# Patient Record
Sex: Female | Born: 1960 | Race: White | Hispanic: No | Marital: Married | Smoking: Never smoker
Health system: Southern US, Community
[De-identification: ages and names within clinical notes are randomized; demographics above are authoritative.]

## PROBLEM LIST (undated history)

## (undated) DIAGNOSIS — E039 Hypothyroidism, unspecified: Secondary | ICD-10-CM

## (undated) DIAGNOSIS — R32 Unspecified urinary incontinence: Secondary | ICD-10-CM

## (undated) DIAGNOSIS — R7309 Other abnormal glucose: Secondary | ICD-10-CM

## (undated) DIAGNOSIS — R7989 Other specified abnormal findings of blood chemistry: Secondary | ICD-10-CM

## (undated) DIAGNOSIS — T7840XA Allergy, unspecified, initial encounter: Secondary | ICD-10-CM

## (undated) DIAGNOSIS — R748 Abnormal levels of other serum enzymes: Secondary | ICD-10-CM

## (undated) DIAGNOSIS — K219 Gastro-esophageal reflux disease without esophagitis: Secondary | ICD-10-CM

## (undated) DIAGNOSIS — L719 Rosacea, unspecified: Secondary | ICD-10-CM

## (undated) DIAGNOSIS — E7800 Pure hypercholesterolemia, unspecified: Secondary | ICD-10-CM

## (undated) DIAGNOSIS — R945 Abnormal results of liver function studies: Secondary | ICD-10-CM

## (undated) DIAGNOSIS — E079 Disorder of thyroid, unspecified: Secondary | ICD-10-CM

## (undated) DIAGNOSIS — D649 Anemia, unspecified: Secondary | ICD-10-CM

## (undated) DIAGNOSIS — M810 Age-related osteoporosis without current pathological fracture: Secondary | ICD-10-CM

## (undated) HISTORY — PX: URETHRAL SLING: SHX2621

## (undated) HISTORY — DX: Abnormal results of liver function studies: R94.5

## (undated) HISTORY — PX: OTHER SURGICAL HISTORY: SHX169

## (undated) HISTORY — DX: Abnormal levels of other serum enzymes: R74.8

## (undated) HISTORY — DX: Disorder of thyroid, unspecified: E07.9

## (undated) HISTORY — DX: Rosacea, unspecified: L71.9

## (undated) HISTORY — PX: CHOLECYSTECTOMY: SHX55

## (undated) HISTORY — PX: CERVICAL BIOPSY  W/ LOOP ELECTRODE EXCISION: SUR135

## (undated) HISTORY — DX: Unspecified urinary incontinence: R32

## (undated) HISTORY — DX: Allergy, unspecified, initial encounter: T78.40XA

## (undated) HISTORY — PX: GYNECOLOGIC CRYOSURGERY: SHX857

## (undated) HISTORY — DX: Anemia, unspecified: D64.9

## (undated) HISTORY — PX: TUBAL LIGATION: SHX77

## (undated) HISTORY — PX: COLPOSCOPY: SHX161

## (undated) HISTORY — DX: Pure hypercholesterolemia, unspecified: E78.00

## (undated) HISTORY — DX: Age-related osteoporosis without current pathological fracture: M81.0

## (undated) HISTORY — DX: Other specified abnormal findings of blood chemistry: R79.89

## (undated) HISTORY — DX: Other abnormal glucose: R73.09

---

## 1991-11-30 HISTORY — PX: TUBAL LIGATION: SHX77

## 1998-12-03 ENCOUNTER — Other Ambulatory Visit: Admission: RE | Admit: 1998-12-03 | Discharge: 1998-12-03 | Payer: Self-pay | Admitting: Obstetrics and Gynecology

## 2000-01-14 ENCOUNTER — Other Ambulatory Visit: Admission: RE | Admit: 2000-01-14 | Discharge: 2000-01-14 | Payer: Self-pay | Admitting: Obstetrics and Gynecology

## 2001-02-08 ENCOUNTER — Other Ambulatory Visit: Admission: RE | Admit: 2001-02-08 | Discharge: 2001-02-08 | Payer: Self-pay | Admitting: Obstetrics and Gynecology

## 2002-02-20 ENCOUNTER — Other Ambulatory Visit: Admission: RE | Admit: 2002-02-20 | Discharge: 2002-02-20 | Payer: Self-pay | Admitting: Obstetrics and Gynecology

## 2003-03-04 ENCOUNTER — Other Ambulatory Visit: Admission: RE | Admit: 2003-03-04 | Discharge: 2003-03-04 | Payer: Self-pay | Admitting: Obstetrics and Gynecology

## 2004-03-11 ENCOUNTER — Other Ambulatory Visit: Admission: RE | Admit: 2004-03-11 | Discharge: 2004-03-11 | Payer: Self-pay | Admitting: Obstetrics and Gynecology

## 2005-03-15 ENCOUNTER — Other Ambulatory Visit: Admission: RE | Admit: 2005-03-15 | Discharge: 2005-03-15 | Payer: Self-pay | Admitting: Obstetrics and Gynecology

## 2005-06-14 ENCOUNTER — Encounter: Admission: RE | Admit: 2005-06-14 | Discharge: 2005-06-14 | Payer: Self-pay | Admitting: Obstetrics and Gynecology

## 2006-09-01 ENCOUNTER — Ambulatory Visit (HOSPITAL_COMMUNITY): Admission: RE | Admit: 2006-09-01 | Discharge: 2006-09-01 | Payer: Self-pay | Admitting: Obstetrics and Gynecology

## 2013-01-23 ENCOUNTER — Other Ambulatory Visit: Payer: Self-pay | Admitting: Obstetrics and Gynecology

## 2013-01-23 DIAGNOSIS — R928 Other abnormal and inconclusive findings on diagnostic imaging of breast: Secondary | ICD-10-CM

## 2013-02-02 ENCOUNTER — Other Ambulatory Visit: Payer: Self-pay

## 2013-02-06 ENCOUNTER — Ambulatory Visit
Admission: RE | Admit: 2013-02-06 | Discharge: 2013-02-06 | Disposition: A | Discharge status: Home / Self Care | Payer: BC Managed Care – PPO | Source: Ambulatory Visit | Attending: Obstetrics and Gynecology | Admitting: Obstetrics and Gynecology

## 2013-02-06 DIAGNOSIS — R928 Other abnormal and inconclusive findings on diagnostic imaging of breast: Secondary | ICD-10-CM

## 2017-10-26 ENCOUNTER — Other Ambulatory Visit: Payer: Self-pay | Admitting: Nephrology

## 2017-10-26 ENCOUNTER — Ambulatory Visit
Admission: RE | Admit: 2017-10-26 | Discharge: 2017-10-26 | Disposition: A | Discharge status: Home / Self Care | Payer: Self-pay | Source: Ambulatory Visit | Attending: Nephrology | Admitting: Nephrology

## 2017-10-26 DIAGNOSIS — R319 Hematuria, unspecified: Secondary | ICD-10-CM

## 2017-10-26 DIAGNOSIS — N289 Disorder of kidney and ureter, unspecified: Secondary | ICD-10-CM

## 2017-11-29 HISTORY — PX: COLONOSCOPY W/ BIOPSIES: SHX1374

## 2018-03-09 ENCOUNTER — Ambulatory Visit (INDEPENDENT_AMBULATORY_CARE_PROVIDER_SITE_OTHER): Payer: No Typology Code available for payment source | Admitting: Obstetrics and Gynecology

## 2018-03-09 ENCOUNTER — Other Ambulatory Visit: Payer: Self-pay

## 2018-03-09 ENCOUNTER — Other Ambulatory Visit (HOSPITAL_COMMUNITY)
Admission: RE | Admit: 2018-03-09 | Discharge: 2018-03-09 | Disposition: A | Discharge status: Home / Self Care | Payer: No Typology Code available for payment source | Source: Ambulatory Visit | Attending: Obstetrics and Gynecology | Admitting: Obstetrics and Gynecology

## 2018-03-09 ENCOUNTER — Encounter: Payer: Self-pay | Admitting: Obstetrics and Gynecology

## 2018-03-09 VITALS — BP 120/78 | HR 94 | Resp 16 | Ht 62.5 in | Wt 138.0 lb

## 2018-03-09 DIAGNOSIS — Z01419 Encounter for gynecological examination (general) (routine) without abnormal findings: Secondary | ICD-10-CM | POA: Diagnosis not present

## 2018-03-09 DIAGNOSIS — Z23 Encounter for immunization: Secondary | ICD-10-CM | POA: Diagnosis not present

## 2018-03-09 DIAGNOSIS — N951 Menopausal and female climacteric states: Secondary | ICD-10-CM | POA: Diagnosis not present

## 2018-03-09 NOTE — Progress Notes (Signed)
57 y.o. G64P0002 Divorced Caucasian female here as a new patient for an annual exam. Referred from family members.  No menses for over one year. Still with some night sweats.  Took Paxil and it worked for hot flashes but had side effects of feeling like she was not interacting with people and had sexual dysfunction.   Vaginal dryness.   Midurethral sling in 2008 with Dr. Arelia Sneddon. Has slower voiding.  No stress incontinence.  Has some urgency but no incontinence with this.   PCP: [facility]  Endocrinologist:  [facility], Dr. Dalbert Mayotte.   Patient's last menstrual period was 12/13/2016.           Sexually active: Yes.    The current method of family planning is tubal ligation.    Exercising: No.  The patient does not participate in regular exercise at present. Smoker:  no  Health Maintenance: Pap:  March 2018 per patient normal History of abnormal Pap:  Yes, patient has had colposcopy and LEEP in alte 1980s or early 1990s.  MMG:  March 2018 done at [facility] - normal per patient Colonoscopy:  12/01/17 polyps removed -- f/u 5 years.  [facility] in [place].  BMD:   March 2018  Result  Done at [facility] -- Osteopenia per patient TDaP:  2009 Gardasil:   n/a HIV: donated blood 2 months ago Hep C: donated blood 2 months ago Screening Labs:  Discuss today   reports that she has never smoked. She has never used smokeless tobacco. She reports that she drank alcohol. She reports that she does not use drugs.  Past Medical History:  Diagnosis Date  . Anemia   . Thyroid disease    hypothyroidism  . Urinary incontinence     Past Surgical History:  Procedure Laterality Date  . CERVICAL BIOPSY  W/ LOOP ELECTRODE EXCISION    . CHOLECYSTECTOMY    . COLPOSCOPY    . Gum Grafts    . GYNECOLOGIC CRYOSURGERY    . TUBAL LIGATION    . URETHRAL SLING      Current Outpatient Medications  Medication Sig Dispense Refill  .  Cholecalciferol (VITAMIN D3) 2000 units TABS Take 1 tablet by mouth daily.    Marland Kitchen levothyroxine (SYNTHROID, LEVOTHROID) 50 MCG tablet Take 50 mcg by mouth daily before breakfast.     No current facility-administered medications for this visit.     Family History  Problem Relation Age of Onset  . Hyperlipidemia Mother   . Gout Father   . Arthritis Father   . Prostate cancer Paternal Grandfather     ROS:  Pertinent items are noted in HPI.  Otherwise, a comprehensive ROS was negative.  Exam:   BP 120/78 (BP Location: Right Arm, Patient Position: Sitting, Cuff Size: Normal)   Pulse 94   Resp 16   Ht 5' 2.5" (1.588 m)   Wt 138 lb (62.6 kg)   LMP 12/13/2016   BMI 24.84 kg/m     General appearance: alert, cooperative and appears stated age Head: Normocephalic, without obvious abnormality, atraumatic Neck: no adenopathy, supple, symmetrical, trachea midline and thyroid normal to inspection and palpation Lungs: clear to auscultation bilaterally Breasts: normal appearance, no masses or tenderness, No nipple retraction or dimpling, No nipple discharge or bleeding, No axillary or supraclavicular adenopathy Heart: regular rate and rhythm Abdomen: soft, non-tender; no masses, no organomegaly Extremities: extremities normal, atraumatic, no cyanosis or edema Skin: Skin color, texture, turgor normal.  No rashes or lesions Lymph nodes: Cervical, supraclavicular, and axillary nodes normal. No abnormal inguinal nodes palpated Neurologic: Grossly normal  Pelvic: External genitalia:  no lesions              Urethra:  normal appearing urethra with no masses, tenderness or lesions              Bartholins and Skenes: normal                 Vagina: normal appearing vagina with normal color and discharge, no lesions              Cervix: no lesions              Pap taken: Yes.   Bimanual Exam:  Uterus:  normal size, contour, position, consistency, mobility, non-tender              Adnexa: no mass,  fullness, tenderness              Rectal exam: Yes.  .  Confirms.              Anus:  normal sphincter tone, no lesions  Chaperone was present for exam.  Assessment:   Well woman visit with normal exam. Hx midurethral sling 2008. Osteopenia.  Menopausal symptoms.  Vaginal dryness. Hx low vit D. Hypothyroidism.  Plan: Mammogram screening discussed. Recommended self breast awareness. Pap and HR HPV as above. Guidelines for Calcium, Vitamin D, regular exercise program including cardiovascular and weight bearing exercise. Routine labs and FSH. Herbal tx for hot flashes.  I discussed water based lubricants and cooking oils for hydration.  Mentioned vaginal estrogen as well. BMD next year. TDap today. Follow up annually and prn.   After visit summary provided.

## 2018-03-09 NOTE — Patient Instructions (Signed)
EXERCISE AND DIET:  We recommended that you start or continue a regular exercise program for good health. Regular exercise means any activity that makes your heart beat faster and makes you sweat.  We recommend exercising at least 30 minutes per day at least 3 days a week, preferably 4 or 5.  We also recommend a diet low in fat and sugar.  Inactivity, poor dietary choices and obesity can cause diabetes, heart attack, stroke, and kidney damage, among others.    ALCOHOL AND SMOKING:  Women should limit their alcohol intake to no more than 7 drinks/beers/glasses of wine (combined, not each!) per week. Moderation of alcohol intake to this level decreases your risk of breast cancer and liver damage. And of course, no recreational drugs are part of a healthy lifestyle.  And absolutely no smoking or even second hand smoke. Most people know smoking can cause heart and lung diseases, but did you know it also contributes to weakening of your bones? Aging of your skin?  Yellowing of your teeth and nails?  CALCIUM AND VITAMIN D:  Adequate intake of calcium and Vitamin D are recommended.  The recommendations for exact amounts of these supplements seem to change often, but generally speaking 600 mg of calcium (either carbonate or citrate) and 800 units of Vitamin D per day seems prudent. Certain women may benefit from higher intake of Vitamin D.  If you are among these women, your doctor will have told you during your visit.    PAP SMEARS:  Pap smears, to check for cervical cancer or precancers,  have traditionally been done yearly, although recent scientific advances have shown that most women can have pap smears less often.  However, every woman still should have a physical exam from her gynecologist every year. It will include a breast check, inspection of the vulva and vagina to check for abnormal growths or skin changes, a visual exam of the cervix, and then an exam to evaluate the size and shape of the uterus and  ovaries.  And after 56 years of age, a rectal exam is indicated to check for rectal cancers. We will also provide age appropriate advice regarding health maintenance, like when you should have certain vaccines, screening for sexually transmitted diseases, bone density testing, colonoscopy, mammograms, etc.   MAMMOGRAMS:  All women over 40 years old should have a yearly mammogram. Many facilities now offer a "3D" mammogram, which may cost around $50 extra out of pocket. If possible,  we recommend you accept the option to have the 3D mammogram performed.  It both reduces the number of women who will be called back for extra views which then turn out to be normal, and it is better than the routine mammogram at detecting truly abnormal areas.    COLONOSCOPY:  Colonoscopy to screen for colon cancer is recommended for all women at age 50.  We know, you hate the idea of the prep.  We agree, BUT, having colon cancer and not knowing it is worse!!  Colon cancer so often starts as a polyp that can be seen and removed at colonscopy, which can quite literally save your life!  And if your first colonoscopy is normal and you have no family history of colon cancer, most women don't have to have it again for 10 years.  Once every ten years, you can do something that may end up saving your life, right?  We will be happy to help you get it scheduled when you are ready.    Be sure to check your insurance coverage so you understand how much it will cost.  It may be covered as a preventative service at no cost, but you should check your particular policy.     Menopause and Herbal Products What is menopause? Menopause is the normal time of life when menstrual periods decrease in frequency and eventually stop completely. This process can take several years for some women. Menopause is complete when you have had an absence of menstruation for a full year since your last menstrual period. It usually occurs between the ages of 48 and  55. It is not common for menopause to begin before the age of 40. During menopause, your body stops producing the female hormones estrogen and progesterone. Common symptoms associated with this loss of hormones (vasomotor symptoms) are:  Hot flashes.  Hot flushes.  Night sweats.  Other common symptoms and complications of menopause include:  Decrease in sex drive.  Vaginal dryness and thinning of the walls of the vagina. This can make sex painful.  Dryness of the skin and development of wrinkles.  Headaches.  Tiredness.  Irritability.  Memory problems.  Weight gain.  Bladder infections.  Hair growth on the face and chest.  Inability to reproduce offspring (infertility).  Loss of density in the bones (osteoporosis) increasing your risk for breaks (fractures).  Depression.  Hardening and narrowing of the arteries (atherosclerosis). This increases your risk of heart attack and stroke.  What treatment options are available? There are many treatment choices for menopause symptoms. The most common treatment is hormone replacement therapy. Many alternative therapies for menopause are emerging, including the use of herbal products. These supplements can be found in the form of herbs, teas, oils, tinctures, and pills. Common herbal supplements for menopause are made from plants that contain phytoestrogens. Phytoestrogens are compounds that occur naturally in plants and plant products. They act like estrogen in the body. Foods and herbs that contain phytoestrogens include:  Soy.  Flax seeds.  Red clover.  Ginseng.  What menopause symptoms may be helped if I use herbal products?  Vasomotor symptoms. These may be helped by: ? Soy. Some studies show that soy may have a moderate benefit for hot flashes. ? Black cohosh. There is limited evidence indicating this may be beneficial for hot flashes.  Symptoms that are related to heart and blood vessel disease. These may be  helped by soy. Studies have shown that soy can help to lower cholesterol.  Depression. This may be helped by: ? St. John's wort. There is limited evidence that shows this may help mild to moderate depression. ? Black cohosh. There is evidence that this may help depression and mood swings.  Osteoporosis. Soy may help to decrease bone loss that is associated with menopause and may prevent osteoporosis. Limited evidence indicates that red clover may offer some bone loss protection as well. Other herbal products that are commonly used during menopause lack enough evidence to support their use as a replacement for conventional menopause therapies. These products include evening primrose, ginseng, and red clover. What are the cases when herbal products should not be used during menopause? Do not use herbal products during menopause without your health care provider's approval if:  You are taking medicine.  You have a preexisting liver condition.  Are there any risks in my taking herbal products during menopause? If you choose to use herbal products to help with symptoms of menopause, keep in mind that:  Different supplements have different and unmeasured   amounts of herbal ingredients.  Herbal products are not regulated the same way that medicines are.  Concentrations of herbs may vary depending on the way they are prepared. For example, the concentration may be different in a pill, tea, oil, and tincture.  Little is known about the risks of using herbal products, particularly the risks of long-term use.  Some herbal supplements can be harmful when combined with certain medicines.  Most commonly reported side effects of herbal products are mild. However, if used improperly, many herbal supplements can cause serious problems. Talk to your health care provider before starting any herbal product. If problems develop, stop taking the supplement and let your health care provider know. This  information is not intended to replace advice given to you by your health care provider. Make sure you discuss any questions you have with your health care provider. Document Released: 05/03/2008 Document Revised: 10/12/2016 Document Reviewed: 04/30/2014 Elsevier Interactive Patient Education  2017 Elsevier Inc.  

## 2018-03-10 ENCOUNTER — Other Ambulatory Visit: Payer: Self-pay | Admitting: Obstetrics and Gynecology

## 2018-03-10 ENCOUNTER — Encounter: Payer: Self-pay | Admitting: Obstetrics and Gynecology

## 2018-03-10 DIAGNOSIS — Z1231 Encounter for screening mammogram for malignant neoplasm of breast: Secondary | ICD-10-CM

## 2018-03-10 LAB — COMPREHENSIVE METABOLIC PANEL
ALBUMIN: 4.2 g/dL (ref 3.5–5.5)
ALK PHOS: 145 IU/L — AB (ref 39–117)
ALT: 50 IU/L — AB (ref 0–32)
AST: 44 IU/L — ABNORMAL HIGH (ref 0–40)
Albumin/Globulin Ratio: 1.4 (ref 1.2–2.2)
BUN / CREAT RATIO: 26 — AB (ref 9–23)
BUN: 24 mg/dL (ref 6–24)
CHLORIDE: 102 mmol/L (ref 96–106)
CO2: 25 mmol/L (ref 20–29)
Calcium: 9.4 mg/dL (ref 8.7–10.2)
Creatinine, Ser: 0.91 mg/dL (ref 0.57–1.00)
GFR calc Af Amer: 82 mL/min/{1.73_m2} (ref >59)
GFR calc non Af Amer: 71 mL/min/{1.73_m2} (ref >59)
GLUCOSE: 115 mg/dL — AB (ref 65–99)
Globulin, Total: 3 g/dL (ref 1.5–4.5)
Potassium: 4.2 mmol/L (ref 3.5–5.2)
Sodium: 142 mmol/L (ref 134–144)
Total Protein: 7.2 g/dL (ref 6.0–8.5)

## 2018-03-10 LAB — LIPID PANEL
CHOLESTEROL TOTAL: 251 mg/dL — AB (ref 100–199)
Chol/HDL Ratio: 2.9 ratio (ref 0.0–4.4)
HDL: 87 mg/dL (ref >39)
LDL CALC: 145 mg/dL — AB (ref 0–99)
TRIGLYCERIDES: 96 mg/dL (ref 0–149)
VLDL Cholesterol Cal: 19 mg/dL (ref 5–40)

## 2018-03-10 LAB — CYTOLOGY - PAP
Diagnosis: NEGATIVE
HPV: NOT DETECTED

## 2018-03-10 LAB — CBC
Hematocrit: 39.7 % (ref 34.0–46.6)
Hemoglobin: 12.5 g/dL (ref 11.1–15.9)
MCH: 28.8 pg (ref 26.6–33.0)
MCHC: 31.5 g/dL (ref 31.5–35.7)
MCV: 92 fL (ref 79–97)
PLATELETS: 346 10*3/uL (ref 150–379)
RBC: 4.34 x10E6/uL (ref 3.77–5.28)
RDW: 13.1 % (ref 12.3–15.4)
WBC: 5.5 10*3/uL (ref 3.4–10.8)

## 2018-03-10 LAB — VITAMIN D 25 HYDROXY (VIT D DEFICIENCY, FRACTURES): Vit D, 25-Hydroxy: 39.3 ng/mL (ref 30.0–100.0)

## 2018-03-10 LAB — FOLLICLE STIMULATING HORMONE: FSH: 123.6 m[IU]/mL

## 2018-03-13 ENCOUNTER — Telehealth: Payer: Self-pay | Admitting: *Deleted

## 2018-03-13 NOTE — Telephone Encounter (Signed)
Labs dated 03/09/18 faxed to [facility] at [phone]. Will close encounter.

## 2018-03-13 NOTE — Telephone Encounter (Signed)
Spoke with patient, advised as seen below per Dr. Edward JollySilva. [facility]. Request copy of 03/09/18 labs [facility]. Patient declines assistance with scheduling, will call to schedule appt. Patient verbalizes understanding and is agreeable.

## 2018-03-13 NOTE — Telephone Encounter (Signed)
Notes recorded by Leda MinHamm, Karlea Mckibbin N, RN on 03/13/2018 at 11:24 AM EDT Left message to call Noreene LarssonJill at [phone].

## 2018-03-13 NOTE — Telephone Encounter (Signed)
-----   Message from Patton SallesBrook E Amundson C Silva, MD sent at 03/10/2018  5:10 PM EDT ----- Please report results to patient.  She has some alterations on her metabolic profile, showing elevated liver function enzymes called AST, ALT, and alkaline phosphatase.  I am not certain of the cause, but this can be due to use of Tylenol, NSAIDs, or even alcohol. Her glucose is elevated and needs a recheck with a hemoglobin A1C.  Her cholesterol is also up a bit due to the elevated LDL cholesterol which measured 145. I would like to have her follow up with her PCP regarding these.  Please send a copy to her and her PCP.  Her FSH confirms menopause.   Her vitamin D and CBC were normal.   Her pap was normal and the HR HPV was negative.  Recall - 02.

## 2018-04-03 ENCOUNTER — Ambulatory Visit
Admission: RE | Admit: 2018-04-03 | Discharge: 2018-04-03 | Disposition: A | Discharge status: Home / Self Care | Payer: No Typology Code available for payment source | Source: Ambulatory Visit | Attending: Obstetrics and Gynecology | Admitting: Obstetrics and Gynecology

## 2018-04-03 DIAGNOSIS — Z1231 Encounter for screening mammogram for malignant neoplasm of breast: Secondary | ICD-10-CM

## 2018-05-04 ENCOUNTER — Encounter (HOSPITAL_COMMUNITY): Payer: Self-pay

## 2018-05-04 ENCOUNTER — Emergency Department (HOSPITAL_COMMUNITY)
Admission: EM | Admit: 2018-05-04 | Discharge: 2018-05-04 | Disposition: A | Discharge status: Home / Self Care | Payer: No Typology Code available for payment source | Attending: Emergency Medicine | Admitting: Emergency Medicine

## 2018-05-04 ENCOUNTER — Emergency Department (HOSPITAL_COMMUNITY): Payer: No Typology Code available for payment source

## 2018-05-04 DIAGNOSIS — R42 Dizziness and giddiness: Secondary | ICD-10-CM | POA: Diagnosis not present

## 2018-05-04 DIAGNOSIS — E861 Hypovolemia: Secondary | ICD-10-CM | POA: Insufficient documentation

## 2018-05-04 DIAGNOSIS — R531 Weakness: Secondary | ICD-10-CM | POA: Diagnosis not present

## 2018-05-04 DIAGNOSIS — R110 Nausea: Secondary | ICD-10-CM | POA: Insufficient documentation

## 2018-05-04 DIAGNOSIS — R231 Pallor: Secondary | ICD-10-CM | POA: Diagnosis not present

## 2018-05-04 DIAGNOSIS — E039 Hypothyroidism, unspecified: Secondary | ICD-10-CM | POA: Diagnosis not present

## 2018-05-04 DIAGNOSIS — I9589 Other hypotension: Secondary | ICD-10-CM | POA: Insufficient documentation

## 2018-05-04 DIAGNOSIS — Z79899 Other long term (current) drug therapy: Secondary | ICD-10-CM | POA: Insufficient documentation

## 2018-05-04 DIAGNOSIS — R55 Syncope and collapse: Secondary | ICD-10-CM | POA: Diagnosis present

## 2018-05-04 LAB — URINALYSIS, ROUTINE W REFLEX MICROSCOPIC
Bilirubin Urine: NEGATIVE
Glucose, UA: NEGATIVE mg/dL
Hgb urine dipstick: NEGATIVE
KETONES UR: 20 mg/dL — AB
LEUKOCYTES UA: NEGATIVE
NITRITE: NEGATIVE
Protein, ur: NEGATIVE mg/dL
Specific Gravity, Urine: 1.017 (ref 1.005–1.030)
pH: 6 (ref 5.0–8.0)

## 2018-05-04 LAB — CBC
HCT: 38.1 % (ref 36.0–46.0)
HEMOGLOBIN: 12.4 g/dL (ref 12.0–15.0)
MCH: 29.5 pg (ref 26.0–34.0)
MCHC: 32.5 g/dL (ref 30.0–36.0)
MCV: 90.5 fL (ref 78.0–100.0)
Platelets: 243 10*3/uL (ref 150–400)
RBC: 4.21 MIL/uL (ref 3.87–5.11)
RDW: 12.7 % (ref 11.5–15.5)
WBC: 7.2 10*3/uL (ref 4.0–10.5)

## 2018-05-04 LAB — I-STAT BETA HCG BLOOD, ED (MC, WL, AP ONLY)

## 2018-05-04 LAB — HEPATIC FUNCTION PANEL
ALBUMIN: 3.3 g/dL — AB (ref 3.5–5.0)
ALK PHOS: 101 U/L (ref 38–126)
ALT: 29 U/L (ref 14–54)
AST: 30 U/L (ref 15–41)
Bilirubin, Direct: 0.1 mg/dL (ref 0.1–0.5)
Indirect Bilirubin: 0.4 mg/dL (ref 0.3–0.9)
TOTAL PROTEIN: 6.2 g/dL — AB (ref 6.5–8.1)
Total Bilirubin: 0.5 mg/dL (ref 0.3–1.2)

## 2018-05-04 LAB — BASIC METABOLIC PANEL
ANION GAP: 10 (ref 5–15)
BUN: 26 mg/dL — ABNORMAL HIGH (ref 6–20)
CO2: 23 mmol/L (ref 22–32)
Calcium: 8.8 mg/dL — ABNORMAL LOW (ref 8.9–10.3)
Chloride: 107 mmol/L (ref 101–111)
Creatinine, Ser: 1.03 mg/dL — ABNORMAL HIGH (ref 0.44–1.00)
GFR, EST NON AFRICAN AMERICAN: 60 mL/min — AB (ref >60)
Glucose, Bld: 122 mg/dL — ABNORMAL HIGH (ref 65–99)
POTASSIUM: 3.4 mmol/L — AB (ref 3.5–5.1)
SODIUM: 140 mmol/L (ref 135–145)

## 2018-05-04 LAB — RAPID URINE DRUG SCREEN, HOSP PERFORMED
AMPHETAMINES: NOT DETECTED
BENZODIAZEPINES: NOT DETECTED
Barbiturates: NOT DETECTED
Cocaine: NOT DETECTED
Opiates: NOT DETECTED
Tetrahydrocannabinol: NOT DETECTED

## 2018-05-04 LAB — LIPASE, BLOOD: LIPASE: 44 U/L (ref 11–51)

## 2018-05-04 LAB — HEMOGLOBIN AND HEMATOCRIT, BLOOD
HCT: 30.6 % — ABNORMAL LOW (ref 36.0–46.0)
HEMOGLOBIN: 9.9 g/dL — AB (ref 12.0–15.0)

## 2018-05-04 LAB — CBG MONITORING, ED: Glucose-Capillary: 130 mg/dL — ABNORMAL HIGH (ref 65–99)

## 2018-05-04 MED ORDER — PROMETHAZINE HCL 25 MG/ML IJ SOLN
12.5000 mg | Freq: Once | INTRAMUSCULAR | Status: AC
  Administered 2018-05-04: 12.5 mg via INTRAVENOUS
  Filled 2018-05-04: qty 1

## 2018-05-04 MED ORDER — SODIUM CHLORIDE 0.9 % IV BOLUS
1000.0000 mL | Freq: Once | INTRAVENOUS | Status: AC
  Administered 2018-05-04: 1000 mL via INTRAVENOUS

## 2018-05-04 MED ORDER — ONDANSETRON HCL 4 MG/2ML IJ SOLN
4.0000 mg | Freq: Once | INTRAMUSCULAR | Status: AC
  Administered 2018-05-04: 4 mg via INTRAVENOUS
  Filled 2018-05-04: qty 2

## 2018-05-04 NOTE — Discharge Instructions (Addendum)
1.  Rest for the next 24 hours, get plenty of fluids and iron rich food. 2.  Return to the emergency department if you develop any pain.  Return if you have another episode of passing out or persistent severe lightheadedness.
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
## 2018-05-04 NOTE — ED Notes (Signed)
Pt removed C-collar 
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
## 2018-05-04 NOTE — ED Notes (Signed)
Pt stable, ambulatory, states understanding of discharge instructions 

## 2018-05-04 NOTE — ED Notes (Signed)
Rec sent to main lab for add on Hepatic function and lipase.

## 2019-01-13 IMAGING — MG DIGITAL SCREENING BILATERAL MAMMOGRAM WITH TOMO AND CAD
8 series · 8 of 24 positions shown · non-contrast
Comparison: Previous exam(s).

CLINICAL DATA: Screening.

EXAM:
DIGITAL SCREENING BILATERAL MAMMOGRAM WITH TOMO AND CAD

[R CC synth-2D]
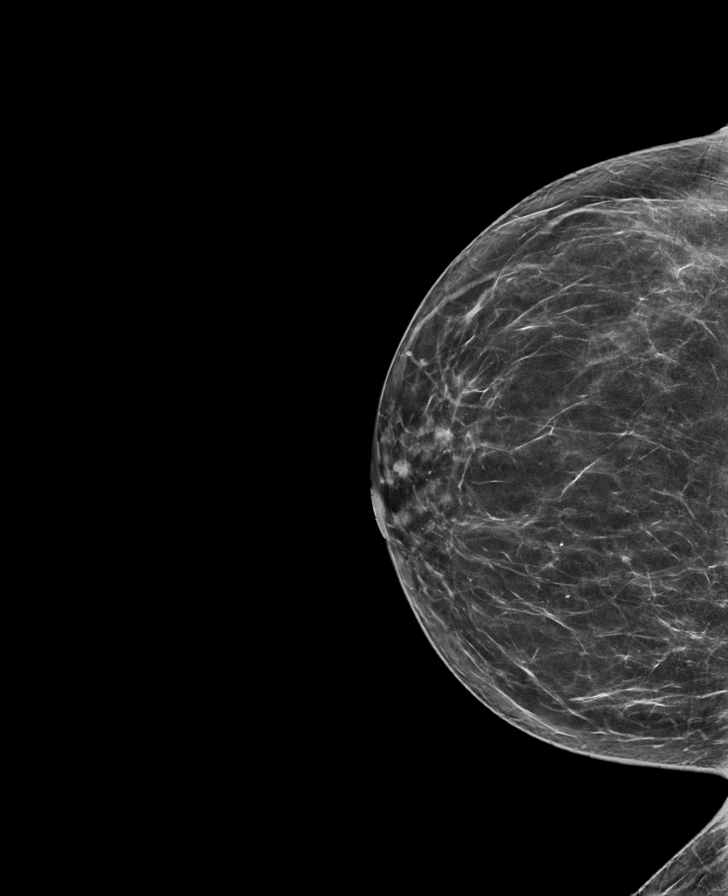

[L MLO synth-2D]
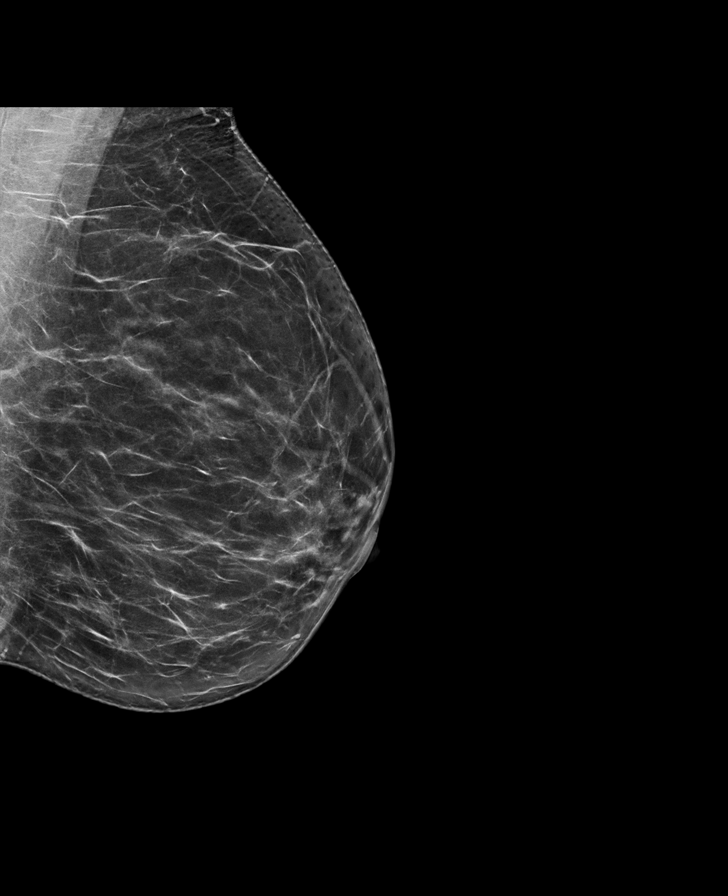

[R MLO synth-2D]
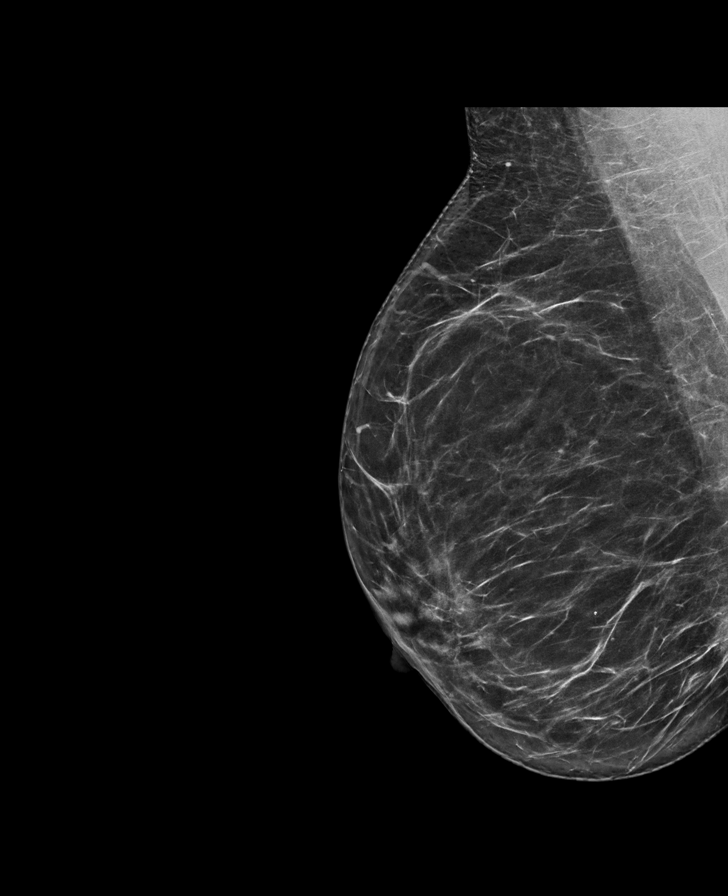

[L CC synth-2D]
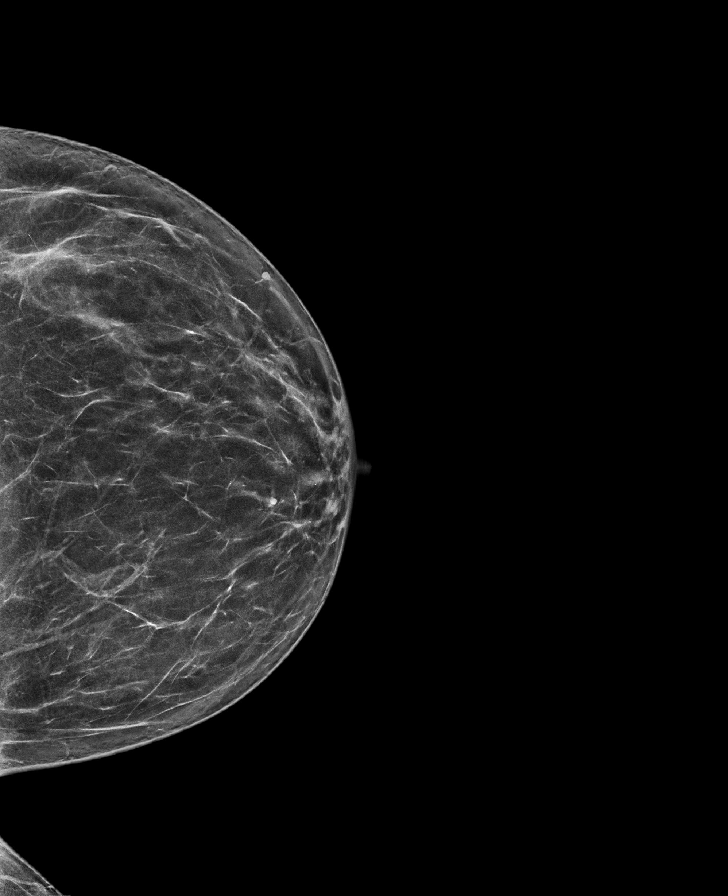

[R CC tomo · tomo slice 38/75.0]
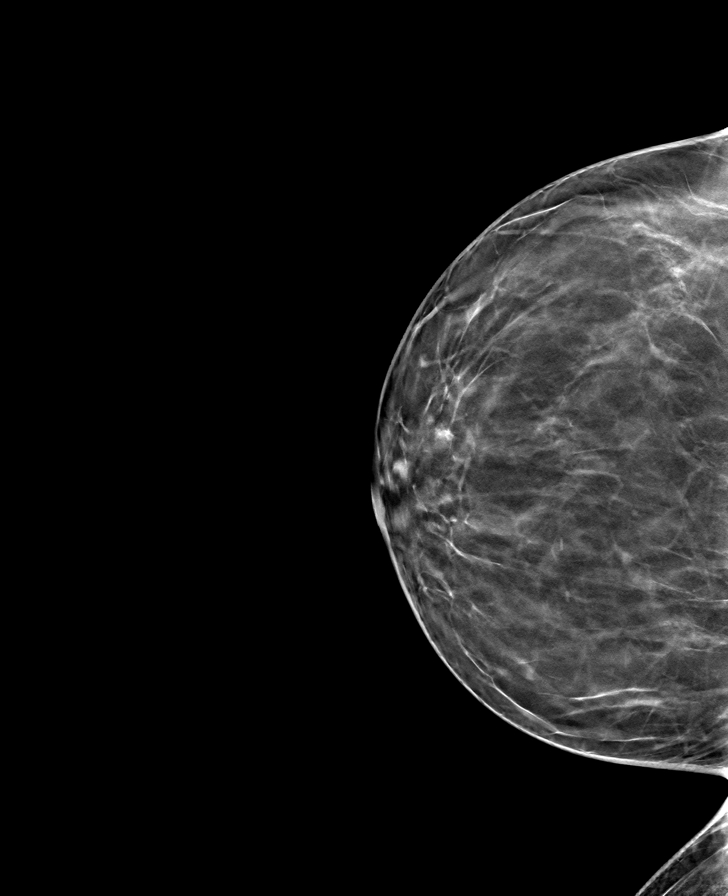

[L CC tomo · tomo slice 36/71.0]
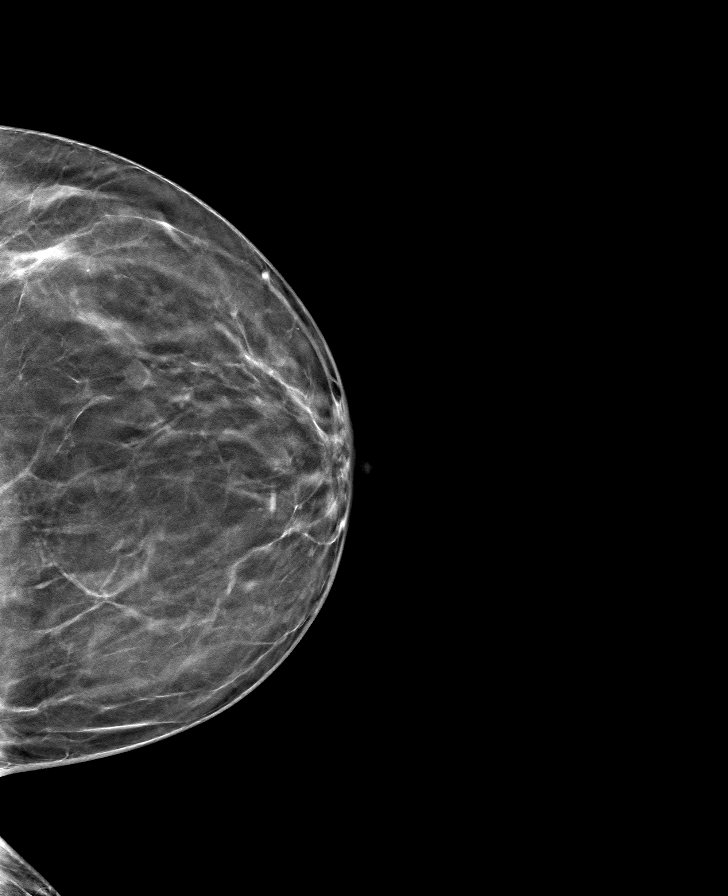

[L MLO tomo · tomo slice 39/78.0]
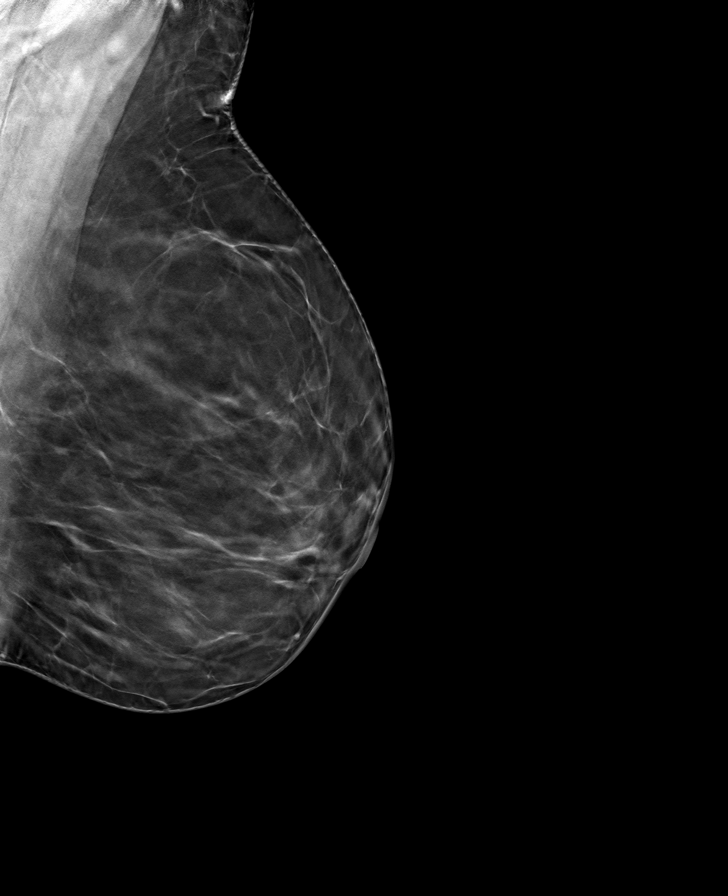

[R MLO tomo · tomo slice 40/79.0]
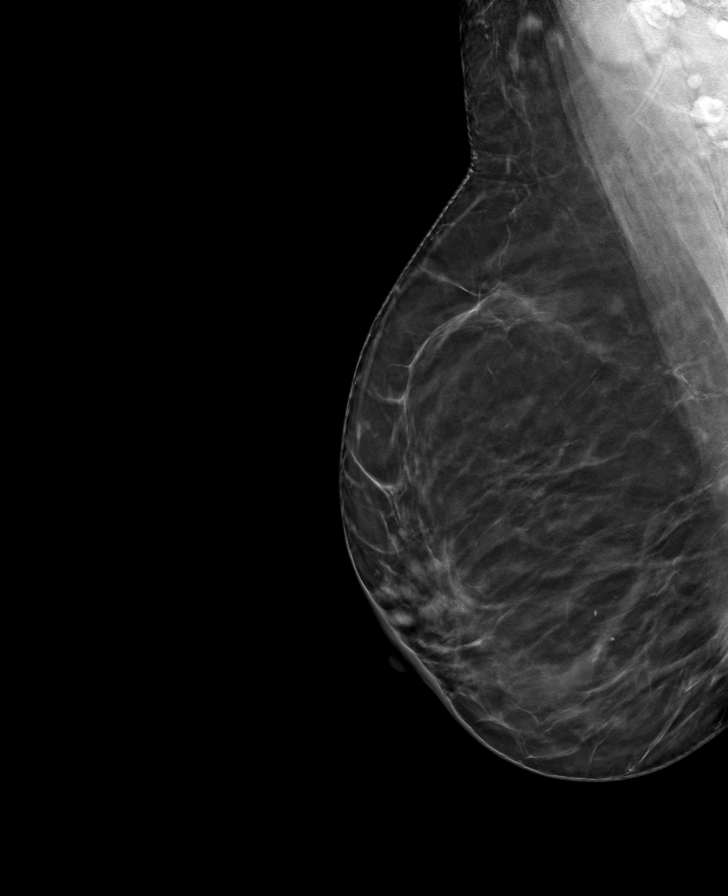

[8 of 24 positions shown; findings below may reference images not displayed]

ACR Breast Density Category b: There are scattered areas of
fibroglandular density.
FINDINGS: There are no findings suspicious for malignancy. Images were
processed with CAD.
IMPRESSION: No mammographic evidence of malignancy. A result letter of this
screening mammogram will be mailed directly to the patient.

RECOMMENDATION:
Screening mammogram in one year. (Code:[id number]-775)

BI-RADS CATEGORY  1: Negative.

## 2019-01-29 DIAGNOSIS — E039 Hypothyroidism, unspecified: Secondary | ICD-10-CM | POA: Insufficient documentation

## 2019-03-22 ENCOUNTER — Ambulatory Visit: Payer: No Typology Code available for payment source | Admitting: Obstetrics and Gynecology

## 2019-04-02 DIAGNOSIS — S300XXA Contusion of lower back and pelvis, initial encounter: Secondary | ICD-10-CM | POA: Insufficient documentation

## 2019-04-02 DIAGNOSIS — S93409A Sprain of unspecified ligament of unspecified ankle, initial encounter: Secondary | ICD-10-CM | POA: Insufficient documentation

## 2019-04-03 DIAGNOSIS — M25672 Stiffness of left ankle, not elsewhere classified: Secondary | ICD-10-CM | POA: Insufficient documentation

## 2019-04-03 DIAGNOSIS — M25572 Pain in left ankle and joints of left foot: Secondary | ICD-10-CM | POA: Insufficient documentation

## 2019-05-21 ENCOUNTER — Other Ambulatory Visit: Payer: Self-pay

## 2019-05-23 ENCOUNTER — Ambulatory Visit: Payer: No Typology Code available for payment source | Admitting: Obstetrics and Gynecology

## 2019-07-20 ENCOUNTER — Other Ambulatory Visit: Payer: Self-pay

## 2019-07-24 ENCOUNTER — Ambulatory Visit (INDEPENDENT_AMBULATORY_CARE_PROVIDER_SITE_OTHER): Payer: No Typology Code available for payment source | Admitting: Obstetrics and Gynecology

## 2019-07-24 ENCOUNTER — Encounter: Payer: Self-pay | Admitting: Obstetrics and Gynecology

## 2019-07-24 ENCOUNTER — Other Ambulatory Visit: Payer: Self-pay

## 2019-07-24 VITALS — BP 102/68 | HR 88 | Temp 97.4°F | Resp 12 | Ht 62.5 in | Wt 134.0 lb

## 2019-07-24 DIAGNOSIS — Z01419 Encounter for gynecological examination (general) (routine) without abnormal findings: Secondary | ICD-10-CM | POA: Diagnosis not present

## 2019-07-24 DIAGNOSIS — Z780 Asymptomatic menopausal state: Secondary | ICD-10-CM | POA: Diagnosis not present

## 2019-07-24 NOTE — Progress Notes (Signed)
58 y.o. [id number] Divorced Caucasian female here for annual exam.    No vaginal bleeding.   Good bladder control.  Just some urgency. a Does drink sodas.   Does not like wearing a mask during the pandemic.  PCP: [facility]     Patient's last menstrual period was 12/13/2016.           Sexually active: Yes.    The current method of family planning is tubal ligation.    Exercising: Yes.    walking Smoker:  no  Health Maintenance: Pap:  03/09/18 Neg:Neg HR HPV History of abnormal Pap:  Yes, patient has had colposcopy and LEEP in late 1980s or early 1990s.  MMG:  04/04/18 BIRADS 1 negative/density b.  She will schedule.  Colonoscopy:  12/01/17 polyps removed -- f/u 5 years.  [facility] [place]  BMD:   March 2018  Result  Osteopenia -- done at [facility]:  03/09/18 HIV and Hep C: donated blood Screening Labs: discuss if needed   reports that she has never smoked. She has never used smokeless tobacco. She reports previous alcohol use. She reports that she does not use drugs.  Past Medical History:  Diagnosis Date  . Anemia   . Elevated glucose level   . Elevated LDL cholesterol level   . Elevated liver function tests   . Rosacea   . Thyroid disease    hypothyroidism  . Urinary incontinence     Past Surgical History:  Procedure Laterality Date  . CERVICAL BIOPSY  W/ LOOP ELECTRODE EXCISION    . CHOLECYSTECTOMY    . COLPOSCOPY    . Gum Grafts    . GYNECOLOGIC CRYOSURGERY    . TUBAL LIGATION    . URETHRAL SLING      Current Outpatient Medications  Medication Sig Dispense Refill  . doxycycline (VIBRAMYCIN) 100 MG capsule doxycycline hyclate 100 mg capsule    . levothyroxine (SYNTHROID, LEVOTHROID) 50 MCG tablet Take 50 mcg by mouth daily before breakfast.    . Multiple Vitamin (MULTIVITAMIN) tablet Take 1 tablet by mouth daily.     No current facility-administered medications for this visit.     Family History  Problem Relation  Age of Onset  . Hyperlipidemia Mother   . Gout Father   . Arthritis Father   . Prostate cancer Paternal Grandfather     Review of Systems  Constitutional: Negative.   HENT: Negative.   Eyes: Negative.   Respiratory: Negative.   Cardiovascular: Negative.   Gastrointestinal: Negative.   Endocrine: Negative.   Genitourinary: Negative.   Musculoskeletal: Negative.   Skin: Negative.   Allergic/Immunologic: Negative.   Neurological: Negative.   Hematological: Negative.   Psychiatric/Behavioral: Negative.     Exam:   BP 102/68 (BP Location: Left Arm, Patient Position: Sitting, Cuff Size: Normal)   Pulse 88   Temp (!) 97.4 F (36.3 C) (Temporal)   Resp 12   Ht 5' 2.5" (1.588 m)   Wt 134 lb (60.8 kg)   LMP 12/13/2016   BMI 24.12 kg/m     General appearance: alert, cooperative and appears stated age Head: normocephalic, without obvious abnormality, atraumatic Neck: no adenopathy, supple, symmetrical, trachea midline and thyroid normal to inspection and palpation Lungs: clear to auscultation bilaterally Breasts: normal appearance, no masses or tenderness, No nipple retraction or dimpling, No nipple discharge or bleeding, No axillary adenopathy Heart: regular rate and rhythm Abdomen: soft, non-tender; no masses, no organomegaly Extremities: extremities normal,  atraumatic, no cyanosis or edema Skin: skin color, texture, turgor normal. No rashes or lesions Lymph nodes: cervical, supraclavicular, and axillary nodes normal. Neurologic: grossly normal  Pelvic: External genitalia:  no lesions              No abnormal inguinal nodes palpated.              Urethra:  normal appearing urethra with no masses, tenderness or lesions              Bartholins and Skenes: normal                 Vagina: normal appearing vagina with normal color and discharge, no lesions              Cervix: no lesions              Pap taken: No. Bimanual Exam:  Uterus:  normal size, contour, position,  consistency, mobility, non-tender              Adnexa: no mass, fullness, tenderness              Rectal exam: Yes.  .  Confirms.              Anus:  normal sphincter tone, no lesions  Chaperone was present for exam.  Assessment:   Well woman visit with normal exam. Hx midurethral sling 2008. Osteopenia.  Hx low vit D. Hypothyroidism.  Plan: Mammogram screening discussed. Self breast awareness reviewed. Pap and HR HPV as above. Guidelines for Calcium, Vitamin D, regular exercise program including cardiovascular and weight bearing exercise. Routine labs. BMD ordered.  She will do the same day as her mammogram. Follow up annually and prn.   After visit summary provided.

## 2019-07-24 NOTE — Patient Instructions (Signed)

## 2019-07-25 ENCOUNTER — Encounter: Payer: Self-pay | Admitting: Obstetrics and Gynecology

## 2019-07-25 LAB — CBC
Hematocrit: 37.2 % (ref 34.0–46.6)
Hemoglobin: 12.9 g/dL (ref 11.1–15.9)
MCH: 31.8 pg (ref 26.6–33.0)
MCHC: 34.7 g/dL (ref 31.5–35.7)
MCV: 92 fL (ref 79–97)
Platelets: 276 10*3/uL (ref 150–450)
RBC: 4.06 x10E6/uL (ref 3.77–5.28)
RDW: 12.5 % (ref 11.7–15.4)
WBC: 5 10*3/uL (ref 3.4–10.8)

## 2019-07-25 LAB — COMPREHENSIVE METABOLIC PANEL
ALT: 19 IU/L (ref 0–32)
AST: 27 IU/L (ref 0–40)
Albumin/Globulin Ratio: 1.7 (ref 1.2–2.2)
Albumin: 4.3 g/dL (ref 3.8–4.9)
Alkaline Phosphatase: 126 IU/L — ABNORMAL HIGH (ref 39–117)
BUN/Creatinine Ratio: 25 — ABNORMAL HIGH (ref 9–23)
BUN: 25 mg/dL — ABNORMAL HIGH (ref 6–24)
Bilirubin Total: 0.2 mg/dL (ref 0.0–1.2)
CO2: 26 mmol/L (ref 20–29)
Calcium: 9.4 mg/dL (ref 8.7–10.2)
Chloride: 102 mmol/L (ref 96–106)
Creatinine, Ser: 1.02 mg/dL — ABNORMAL HIGH (ref 0.57–1.00)
GFR calc Af Amer: 71 mL/min/{1.73_m2} (ref >59)
GFR calc non Af Amer: 61 mL/min/{1.73_m2} (ref >59)
Globulin, Total: 2.6 g/dL (ref 1.5–4.5)
Glucose: 103 mg/dL — ABNORMAL HIGH (ref 65–99)
Potassium: 3.8 mmol/L (ref 3.5–5.2)
Sodium: 141 mmol/L (ref 134–144)
Total Protein: 6.9 g/dL (ref 6.0–8.5)

## 2019-07-25 LAB — LIPID PANEL
Chol/HDL Ratio: 2.7 ratio (ref 0.0–4.4)
Cholesterol, Total: 207 mg/dL — ABNORMAL HIGH (ref 100–199)
HDL: 78 mg/dL (ref >39)
LDL Calculated: 111 mg/dL — ABNORMAL HIGH (ref 0–99)
Triglycerides: 92 mg/dL (ref 0–149)
VLDL Cholesterol Cal: 18 mg/dL (ref 5–40)

## 2019-07-25 LAB — VITAMIN D 25 HYDROXY (VIT D DEFICIENCY, FRACTURES): Vit D, 25-Hydroxy: 30.6 ng/mL (ref 30.0–100.0)

## 2019-08-14 ENCOUNTER — Other Ambulatory Visit: Payer: Self-pay | Admitting: Obstetrics and Gynecology

## 2019-08-14 DIAGNOSIS — Z1231 Encounter for screening mammogram for malignant neoplasm of breast: Secondary | ICD-10-CM

## 2019-09-13 DIAGNOSIS — M7540 Impingement syndrome of unspecified shoulder: Secondary | ICD-10-CM | POA: Insufficient documentation

## 2019-10-24 ENCOUNTER — Ambulatory Visit: Payer: No Typology Code available for payment source

## 2019-10-24 ENCOUNTER — Inpatient Hospital Stay: Admission: RE | Admit: 2019-10-24 | Payer: No Typology Code available for payment source | Source: Ambulatory Visit

## 2019-11-29 ENCOUNTER — Encounter

## 2020-01-17 ENCOUNTER — Other Ambulatory Visit: Payer: No Typology Code available for payment source

## 2020-01-17 ENCOUNTER — Ambulatory Visit: Payer: No Typology Code available for payment source

## 2020-03-11 ENCOUNTER — Other Ambulatory Visit: Payer: Self-pay

## 2020-03-11 ENCOUNTER — Ambulatory Visit: Payer: No Typology Code available for payment source | Admitting: Gastroenterology

## 2020-03-11 ENCOUNTER — Encounter: Payer: Self-pay | Admitting: Gastroenterology

## 2020-03-11 VITALS — BP 117/67 | HR 80 | Temp 98.3°F | Ht 62.5 in | Wt 135.0 lb

## 2020-03-11 DIAGNOSIS — R748 Abnormal levels of other serum enzymes: Secondary | ICD-10-CM | POA: Diagnosis not present

## 2020-03-11 DIAGNOSIS — K2100 Gastro-esophageal reflux disease with esophagitis, without bleeding: Secondary | ICD-10-CM

## 2020-03-11 MED ORDER — PANTOPRAZOLE SODIUM 40 MG PO TBEC: 40.0000 mg | DELAYED_RELEASE_TABLET | Freq: Every day | ORAL | 1 refills | Status: DC

## 2020-03-11 NOTE — Progress Notes (Signed)
Gastroenterology Consultation  Referring Provider:     Raj Janus, MD Primary Care Physician: [facility] Primary Gastroenterologist:  Dr. Servando Snare     Reason for Consultation:     Abnormal liver enzymes        HPI:   Whitney Lawrence is a 59 y.o. y/o female referred for consultation & management of abnormal liver enzymes by Dr. Arsenio Loader, [facility].  This patient comes in today with a history of being seen by Dr. Mechele Collin in the past for a colonoscopy and was found to have abnormal liver enzymes.  The patient's most recent liver enzymes showed the alkaline phosphatase to be 120 with the AST of 40 and ALT of 42.  The patient is followed by endocrinology for hypothyroidism and had reported that her OB/GYN had found her to have elevated liver enzymes in October 2020.  It appears that the patient had an isolated alkaline phosphatase in August 2020 and had abnormal liver enzymes dating back to April 2019.  The liver enzymes have shown:  Component     Latest Ref Rng & Units 03/09/2018 05/04/2018 07/24/2019  Alkaline Phosphatase     39 - 117 IU/L 145 (H) 101 126 (H)  AST     0 - 40 IU/L 44 (H) 30 27  ALT     0 - 32 IU/L 50 (H) 29 19   The patient is status post cholecystectomy. The patient denies anything she can relate to going on in her life at the times that her liver enzymes have gone up or gone down. She denies any jaundice black stools bloody stools fevers chills nausea or vomiting.  The patient does report that she is been having a lot of heartburn and reports that her part burn is every day.  She also reports that it is going on for over a year.  She also states that she has some food feeling like it gets stuck on the way down.  She is not presently on any acid suppressing medication.  Past Medical History:  Diagnosis Date  . Anemia   . Elevated alkaline phosphatase level   . Elevated glucose level   . Elevated LDL cholesterol level   . Elevated serum creatinine   .  Rosacea   . Thyroid disease    hypothyroidism  . Urinary incontinence     Past Surgical History:  Procedure Laterality Date  . CERVICAL BIOPSY  W/ LOOP ELECTRODE EXCISION    . CHOLECYSTECTOMY    . COLPOSCOPY    . Gum Grafts    . GYNECOLOGIC CRYOSURGERY    . TUBAL LIGATION    . URETHRAL SLING      Prior to Admission medications   Medication Sig Start Date End Date Taking? Authorizing Provider  doxycycline (VIBRAMYCIN) 100 MG capsule doxycycline hyclate 100 mg capsule    [provider]  levothyroxine (SYNTHROID, LEVOTHROID) 50 MCG tablet Take 50 mcg by mouth daily before breakfast.    [provider]  Multiple Vitamin (MULTIVITAMIN) tablet Take 1 tablet by mouth daily.    [provider]    Family History  Problem Relation Age of Onset  . Hyperlipidemia Mother   . Gout Father   . Arthritis Father   . Prostate cancer Paternal Grandfather      Social History   Tobacco Use  . Smoking status: Never Smoker  . Smokeless tobacco: Never Used  Substance Use Topics  . Alcohol use: Not Currently  .  Drug use: Never    Allergies as of 03/11/2020  . (No Known Allergies)    Review of Systems:    All systems reviewed and negative except where noted in HPI.   Physical Exam:  LMP 12/13/2016  Patient's last menstrual period was 12/13/2016. General:   Alert,  Well-developed, well-nourished, pleasant and cooperative in NAD Head:  Normocephalic and atraumatic. Eyes:  Sclera clear, no icterus.   Conjunctiva pink. Ears:  Normal auditory acuity. Neck:  Supple; no masses or thyromegaly. Lungs:  Respirations even and unlabored.  Clear throughout to auscultation.   No wheezes, crackles, or rhonchi. No acute distress. Heart:  Regular rate and rhythm; no murmurs, clicks, rubs, or gallops. Abdomen:  Normal bowel sounds.  No bruits.  Soft, non-tender and non-distended without masses, hepatosplenomegaly or hernias noted.  No guarding or rebound tenderness.   Negative Carnett sign.   Rectal:  Deferred.  Pulses:  Normal pulses noted. Extremities:  No clubbing or edema.  No cyanosis. Neurologic:  Alert and oriented x3;  grossly normal neurologically. Skin:  Intact without significant lesions or rashes.  No jaundice. Lymph Nodes:  No significant cervical adenopathy. Psych:  Alert and cooperative. Normal mood and affect.  Imaging Studies: No results found.  Assessment and Plan:   [name] is a 59 y.o. y/o female Who comes in today with abnormal liver enzymes.  The patient's liver enzymes have fluctuated up and down.  They have even returned to normal at times.  The patient denies anything that she can attribute to what may be causing her liver enzymes go up and down.  She denies any high risk activity for any viral hepatitis.  The patient will have her labs sent off for possible causes of her abnormal liver enzymes.  The patient will also be set up for a right upper quadrant ultrasound to rule out fatty liver is the cause.  She will also be set up for an EGD due to her chronic heartburn to rule out Barrett's and because of her report of dysphagia.  She will also be started on Protonix 40 mg a day.  The patient has been explained the plan and agrees with it.    Lucilla Lame, MD. Marval Regal    Note: This dictation was prepared with Dragon dictation along with smaller phrase technology. Any transcriptional errors that result from this process are unintentional.

## 2020-03-15 LAB — ANA: Anti Nuclear Antibody (ANA): NEGATIVE

## 2020-03-15 LAB — HEPATIC FUNCTION PANEL
ALT: 33 IU/L — ABNORMAL HIGH (ref 0–32)
AST: 37 IU/L (ref 0–40)
Albumin: 4.4 g/dL (ref 3.8–4.9)
Alkaline Phosphatase: 136 IU/L — ABNORMAL HIGH (ref 39–117)
Bilirubin Total: 0.2 mg/dL (ref 0.0–1.2)
Bilirubin, Direct: 0.09 mg/dL (ref 0.00–0.40)
Total Protein: 7 g/dL (ref 6.0–8.5)

## 2020-03-15 LAB — ANTI-SMOOTH MUSCLE ANTIBODY, IGG: Smooth Muscle Ab: 26 Units — ABNORMAL HIGH (ref 0–19)

## 2020-03-15 LAB — MITOCHONDRIAL ANTIBODIES: Mitochondrial Ab: <20 Units (ref 0.0–20.0)

## 2020-03-15 LAB — HEPATITIS B SURFACE ANTIBODY,QUALITATIVE: Hep B Surface Ab, Qual: NONREACTIVE

## 2020-03-15 LAB — CERULOPLASMIN: Ceruloplasmin: 26.1 mg/dL (ref 19.0–39.0)

## 2020-03-15 LAB — ALKALINE PHOSPHATASE, ISOENZYMES
BONE FRACTION: 39 % (ref 14–68)
INTESTINAL FRAC.: 26 % — ABNORMAL HIGH (ref 0–18)
LIVER FRACTION: 35 % (ref 18–85)

## 2020-03-15 LAB — HEPATITIS B SURFACE ANTIGEN: Hepatitis B Surface Ag: NEGATIVE

## 2020-03-15 LAB — GAMMA GT: GGT: 51 IU/L (ref 0–60)

## 2020-03-15 LAB — HEPATITIS A ANTIBODY, TOTAL: hep A Total Ab: NEGATIVE

## 2020-03-15 LAB — ALPHA-1-ANTITRYPSIN: A-1 Antitrypsin: 140 mg/dL (ref 101–187)

## 2020-03-21 ENCOUNTER — Telehealth: Payer: Self-pay

## 2020-03-21 NOTE — Telephone Encounter (Signed)
-----   Message from Midge Minium, MD sent at 03/18/2020  9:15 AM EDT ----- Let the patient know that she is not immune to hepatitis A or hepatitis B. The fractionation of her alkaline phosphatase showed it not to be from a liver source therefore further work-up of the abnormal alkaline phosphatase is not needed.

## 2020-03-21 NOTE — Telephone Encounter (Signed)
Pt notified of lab results

## 2020-03-24 ENCOUNTER — Other Ambulatory Visit: Payer: Self-pay

## 2020-03-24 ENCOUNTER — Encounter: Payer: Self-pay | Admitting: Gastroenterology

## 2020-04-07 ENCOUNTER — Ambulatory Visit
Admission: RE | Admit: 2020-04-07 | Discharge: 2020-04-07 | Disposition: A | Discharge status: Home / Self Care | Payer: No Typology Code available for payment source | Source: Ambulatory Visit | Attending: Gastroenterology | Admitting: Gastroenterology

## 2020-04-07 ENCOUNTER — Other Ambulatory Visit: Payer: Self-pay

## 2020-04-07 DIAGNOSIS — R748 Abnormal levels of other serum enzymes: Secondary | ICD-10-CM | POA: Diagnosis not present

## 2020-04-08 ENCOUNTER — Telehealth: Payer: Self-pay

## 2020-04-08 NOTE — Telephone Encounter (Signed)
-----   Message from Midge Minium, MD sent at 04/07/2020  3:16 PM EDT ----- Let the patient know that her ultrasound of her liver was normal.

## 2020-04-08 NOTE — Telephone Encounter (Signed)
Pt notified of ultrasound results via my chart.   

## 2020-04-10 ENCOUNTER — Other Ambulatory Visit: Payer: Self-pay

## 2020-04-10 ENCOUNTER — Other Ambulatory Visit
Admission: RE | Admit: 2020-04-10 | Discharge: 2020-04-10 | Disposition: A | Discharge status: Home / Self Care | Payer: No Typology Code available for payment source | Source: Ambulatory Visit | Attending: Gastroenterology | Admitting: Gastroenterology

## 2020-04-10 DIAGNOSIS — Z20822 Contact with and (suspected) exposure to covid-19: Secondary | ICD-10-CM | POA: Insufficient documentation

## 2020-04-10 DIAGNOSIS — Z01812 Encounter for preprocedural laboratory examination: Secondary | ICD-10-CM | POA: Insufficient documentation

## 2020-04-10 LAB — SARS CORONAVIRUS 2 (TAT 6-24 HRS): SARS Coronavirus 2: NEGATIVE

## 2020-04-14 ENCOUNTER — Encounter: Payer: Self-pay | Admitting: Gastroenterology

## 2020-04-14 ENCOUNTER — Ambulatory Visit
Admission: RE | Admit: 2020-04-14 | Discharge: 2020-04-14 | Disposition: A | Discharge status: Home / Self Care | Payer: No Typology Code available for payment source | Source: Ambulatory Visit | Attending: Gastroenterology | Admitting: Gastroenterology

## 2020-04-14 ENCOUNTER — Ambulatory Visit: Payer: No Typology Code available for payment source | Admitting: Anesthesiology

## 2020-04-14 ENCOUNTER — Encounter
Admission: RE | Disposition: A | Discharge status: Home / Self Care | Payer: Self-pay | Source: Ambulatory Visit | Attending: Gastroenterology

## 2020-04-14 ENCOUNTER — Other Ambulatory Visit: Payer: Self-pay

## 2020-04-14 DIAGNOSIS — Z79890 Hormone replacement therapy (postmenopausal): Secondary | ICD-10-CM | POA: Diagnosis not present

## 2020-04-14 DIAGNOSIS — Z79899 Other long term (current) drug therapy: Secondary | ICD-10-CM | POA: Insufficient documentation

## 2020-04-14 DIAGNOSIS — K228 Other specified diseases of esophagus: Secondary | ICD-10-CM

## 2020-04-14 DIAGNOSIS — K2289 Other specified disease of esophagus: Secondary | ICD-10-CM

## 2020-04-14 DIAGNOSIS — K2100 Gastro-esophageal reflux disease with esophagitis, without bleeding: Secondary | ICD-10-CM | POA: Insufficient documentation

## 2020-04-14 DIAGNOSIS — R12 Heartburn: Secondary | ICD-10-CM

## 2020-04-14 DIAGNOSIS — E039 Hypothyroidism, unspecified: Secondary | ICD-10-CM | POA: Diagnosis not present

## 2020-04-14 HISTORY — PX: ESOPHAGOGASTRODUODENOSCOPY (EGD) WITH PROPOFOL: SHX5813

## 2020-04-14 HISTORY — DX: Gastro-esophageal reflux disease without esophagitis: K21.9

## 2020-04-14 HISTORY — DX: Hypothyroidism, unspecified: E03.9

## 2020-04-14 SURGERY — ESOPHAGOGASTRODUODENOSCOPY (EGD) WITH PROPOFOL
Anesthesia: General | Site: Mouth

## 2020-04-14 MED ORDER — LIDOCAINE HCL (CARDIAC) PF 100 MG/5ML IV SOSY
PREFILLED_SYRINGE | INTRAVENOUS | Status: DC | PRN
  Administered 2020-04-14: 30 mg via INTRAVENOUS

## 2020-04-14 MED ORDER — STERILE WATER FOR IRRIGATION IR SOLN
Status: DC | PRN
  Administered 2020-04-14: 25 mL

## 2020-04-14 MED ORDER — LACTATED RINGERS IV SOLN: INTRAVENOUS | Status: DC

## 2020-04-14 MED ORDER — PROPOFOL 10 MG/ML IV BOLUS
INTRAVENOUS | Status: DC | PRN
  Administered 2020-04-14: 150 mg via INTRAVENOUS

## 2020-04-14 SURGICAL SUPPLY — 7 items
BLOCK BITE 60FR ADLT L/F GRN (MISCELLANEOUS) ×3 IMPLANT
FORCEPS BIOP RAD 4 LRG CAP 4 (CUTTING FORCEPS) ×2 IMPLANT
GOWN CVR UNV OPN BCK APRN NK (MISCELLANEOUS) ×2 IMPLANT
GOWN ISOL THUMB LOOP REG UNIV (MISCELLANEOUS) ×6
KIT ENDO PROCEDURE OLY (KITS) ×3 IMPLANT
MANIFOLD NEPTUNE II (INSTRUMENTS) ×2 IMPLANT
WATER STERILE IRR 250ML POUR (IV SOLUTION) ×3 IMPLANT

## 2020-04-14 NOTE — H&P (Signed)
Lucilla Lame, MD [facility] [address] [place], [place] [address] [phone] Fax : [phone]  Primary Care Physician:  [facility] Primary Gastroenterologist:  Dr. Allen Norris  Pre-Procedure History & Physical: HPI:  Whitney Lawrence is a 59 y.o. female is here for an endoscopy.   Past Medical History:  Diagnosis Date  . Anemia   . Elevated alkaline phosphatase level   . Elevated glucose level   . Elevated LDL cholesterol level   . Elevated serum creatinine   . GERD (gastroesophageal reflux disease)   . Hypothyroidism   . Rosacea   . Thyroid disease    hypothyroidism  . Urinary incontinence     Past Surgical History:  Procedure Laterality Date  . CERVICAL BIOPSY  W/ LOOP ELECTRODE EXCISION    . CHOLECYSTECTOMY    . COLPOSCOPY    . Gum Grafts    . GYNECOLOGIC CRYOSURGERY    . TUBAL LIGATION    . URETHRAL SLING      Prior to Admission medications   Medication Sig Start Date End Date Taking? Authorizing Provider  cyclobenzaprine (FLEXERIL) 10 MG tablet cyclobenzaprine 10 mg tablet  TAKE 1 TABLET BY MOUTH EVERY DAY AT BEDTIME   Yes [provider]  doxycycline (VIBRAMYCIN) 100 MG capsule doxycycline hyclate 100 mg capsule   Yes [provider]  levothyroxine (SYNTHROID, LEVOTHROID) 50 MCG tablet Take 50 mcg by mouth daily before breakfast.   Yes [provider]  Multiple Vitamin (MULTIVITAMIN) tablet Take 1 tablet by mouth daily.   Yes [provider]  Multiple Vitamins-Minerals (ZINC PO) Take by mouth daily.   Yes [provider]  pantoprazole (PROTONIX) 40 MG tablet Take 1 tablet (40 mg total) by mouth daily. 03/11/20  Yes Lucilla Lame, MD    Allergies as of 03/11/2020  . (No Known Allergies)    Family History  Problem Relation Age of Onset  . Hyperlipidemia Mother   . Gout Father   . Arthritis Father   . Prostate cancer Paternal Grandfather     Social History   Socioeconomic  History  . Marital status: Married    Spouse name: Not on file  . Number of children: Not on file  . Years of education: Not on file  . Highest education level: Not on file  Occupational History  . Not on file  Tobacco Use  . Smoking status: Never Smoker  . Smokeless tobacco: Never Used  Substance and Sexual Activity  . Alcohol use: Not Currently  . Drug use: Never  . Sexual activity: Yes    Birth control/protection: Surgical    Comment: tubal ligation  Other Topics Concern  . Not on file  Social History Narrative  . Not on file   Social Determinants of Health   Financial Resource Strain:   . Difficulty of Paying Living Expenses:   Food Insecurity:   . Worried About Charity fundraiser in the Last Year:   . Arboriculturist in the Last Year:   Transportation Needs:   . Film/video editor (Medical):   Marland Kitchen Lack of Transportation (Non-Medical):   Physical Activity:   . Days of Exercise per Week:   . Minutes of Exercise per Session:   Stress:   . Feeling of Stress :   Social Connections:   . Frequency of Communication with Friends and Family:   . Frequency of Social Gatherings with Friends and Family:   . Attends Religious Services:   .  Active Member of Clubs or Organizations:   . Attends Banker Meetings:   Marland Kitchen Marital Status:   Intimate Partner Violence:   . Fear of Current or Ex-Partner:   . Emotionally Abused:   Marland Kitchen Physically Abused:   . Sexually Abused:     Review of Systems: See HPI, otherwise negative ROS  Physical Exam: BP 126/85   Pulse 86   Temp 97.6 F (36.4 C) (Temporal)   Resp 18   Ht 5' 2.5" (1.588 m)   Wt 59.2 kg   LMP 12/13/2016   SpO2 98%   BMI 23.51 kg/m  General:   Alert,  pleasant and cooperative in NAD Head:  Normocephalic and atraumatic. Neck:  Supple; no masses or thyromegaly. Lungs:  Clear throughout to auscultation.    Heart:  Regular rate and rhythm. Abdomen:  Soft, nontender and nondistended. Normal bowel sounds,  without guarding, and without rebound.   Neurologic:  Alert and  oriented x4;  grossly normal neurologically.  Impression/Plan: Whitney Lawrence is here for an endoscopy to be performed for GERD  Risks, benefits, limitations, and alternatives regarding  endoscopy have been reviewed with the patient.  Questions have been answered.  All parties agreeable.   Midge Minium, MD  04/14/2020, 10:37 AM

## 2020-04-14 NOTE — Op Note (Addendum)
[facility] Gastroenterology Patient Name: Whitney Lawrence Procedure Date: 04/14/2020 10:34 AM MRN: [id number] Account #: [id number] Date of Birth: 25-Nov-1961 Admit Type: Outpatient Age: 59 Room: [facility] OR ROOM 01 Gender: Female Note Status: Finalized Procedure:             Upper GI endoscopy Indications:           Heartburn Providers:             Lucilla Lame MD, MD Medicines:             Propofol per Anesthesia Complications:         No immediate complications. Procedure:             Pre-Anesthesia Assessment:                        - Prior to the procedure, a History and Physical was                         performed, and patient medications and allergies were                         reviewed. The patient's tolerance of previous                         anesthesia was also reviewed. The risks and benefits                         of the procedure and the sedation options and risks                         were discussed with the patient. All questions were                         answered, and informed consent was obtained. Prior                         Anticoagulants: The patient has taken no previous                         anticoagulant or antiplatelet agents. ASA Grade                         Assessment: II - A patient with mild systemic disease.                         After reviewing the risks and benefits, the patient                         was deemed in satisfactory condition to undergo the                         procedure.                        After obtaining informed consent, the endoscope was                         passed under direct vision. Throughout the procedure,  the patient's blood pressure, pulse, and oxygen                         saturations were monitored continuously. The Endoscope                         was introduced through the mouth, and advanced to the                         second part of duodenum. The upper  GI endoscopy was                         accomplished without difficulty. The patient tolerated                         the procedure well. Findings:      There were esophageal mucosal changes suspicious for long-segment       Barrett's esophagus present in the lower third of the esophagus. The       maximum longitudinal extent of these mucosal changes was 3 cm in length.       This was biopsied with a cold forceps for histology.      The stomach was normal.      The examined duodenum was normal. Impression:            - Esophageal mucosal changes suspicious for                         long-segment Barrett's esophagus. Biopsied.                        - Normal stomach.                        - Normal examined duodenum. Recommendation:        - Discharge patient to home.                        - Resume previous diet.                        - Continue present medications.                        - Await pathology results. Procedure Code(s):     --- Professional ---                        [id number], Esophagogastroduodenoscopy, flexible,                         transoral; with biopsy, single or multiple Diagnosis Code(s):     --- Professional ---                        R12, Heartburn                        K22.8, Other specified diseases of esophagus CPT copyright 2019 American Medical Association. All rights reserved. The codes documented in this report are preliminary and upon coder review may  be revised to meet current compliance requirements. Midge Minium MD, MD 04/14/2020  10:53:39 AM This report has been signed electronically. Number of Addenda: 0 Note Initiated On: 04/14/2020 10:34 AM Total Procedure Duration: 0 hours 2 minutes 45 seconds  Estimated Blood Loss:  Estimated blood loss: none.      [facility]

## 2020-04-14 NOTE — Anesthesia Procedure Notes (Signed)
Date/Time: 04/14/2020 10:46 AM Performed by: Maree Krabbe, CRNA Pre-anesthesia Checklist: Patient identified, Emergency Drugs available, Suction available, Timeout performed and Patient being monitored Patient Re-evaluated:Patient Re-evaluated prior to induction Oxygen Delivery Method: Nasal cannula Placement Confirmation: positive ETCO2

## 2020-04-14 NOTE — Anesthesia Postprocedure Evaluation (Signed)
Anesthesia Post Note  Patient: Whitney Lawrence  Procedure(s) Performed: ESOPHAGOGASTRODUODENOSCOPY (EGD) WITH PROPOFOL (N/A Mouth)     Anesthesia Post Evaluation  Fletcher Anon

## 2020-04-14 NOTE — Anesthesia Preprocedure Evaluation (Signed)
Anesthesia Evaluation  Patient identified by MRN, date of birth, ID band Patient awake    Reviewed: Allergy & Precautions, NPO status , Patient's Chart, lab work & pertinent test results  Airway Mallampati: II  TM Distance: >3 FB Neck ROM: Full    Dental no notable dental hx.    Pulmonary neg pulmonary ROS,    Pulmonary exam normal        Cardiovascular negative cardio ROS Normal cardiovascular exam     Neuro/Psych negative neurological ROS  negative psych ROS   GI/Hepatic GERD  Controlled,  Endo/Other  Hypothyroidism   Renal/GU   negative genitourinary   Musculoskeletal  (+) Arthritis , Osteoarthritis,    Abdominal Normal abdominal exam  (+)   Peds  Hematology negative hematology ROS (+)   Anesthesia Other Findings   Reproductive/Obstetrics negative OB ROS                             Anesthesia Physical Anesthesia Plan  ASA: II  Anesthesia Plan: General   Post-op Pain Management:    Induction: Intravenous  PONV Risk Score and Plan: 3 and Propofol infusion, TIVA and Treatment may vary due to age or medical condition  Airway Management Planned: Natural Airway and Nasal Cannula  Additional Equipment: None  Intra-op Plan:   Post-operative Plan:   Informed Consent: I have reviewed the patients History and Physical, chart, labs and discussed the procedure including the risks, benefits and alternatives for the proposed anesthesia with the patient or authorized representative who has indicated his/her understanding and acceptance.     Dental advisory given  Plan Discussed with: CRNA  Anesthesia Plan Comments:         Anesthesia Quick Evaluation

## 2020-04-14 NOTE — Transfer of Care (Signed)
Immediate Anesthesia Transfer of Care Note  Patient: Whitney Lawrence  Procedure(s) Performed: ESOPHAGOGASTRODUODENOSCOPY (EGD) WITH PROPOFOL (N/A Mouth)  Patient Location: PACU  Anesthesia Type: General  Level of Consciousness: awake, alert  and patient cooperative  Airway and Oxygen Therapy: Patient Spontanous Breathing and Patient connected to supplemental oxygen  Post-op Assessment: Post-op Vital signs reviewed, Patient's Cardiovascular Status Stable, Respiratory Function Stable, Patent Airway and No signs of Nausea or vomiting  Post-op Vital Signs: Reviewed and stable  Complications: No apparent anesthesia complications

## 2020-04-15 ENCOUNTER — Encounter: Payer: Self-pay | Admitting: *Deleted

## 2020-04-15 LAB — SURGICAL PATHOLOGY

## 2020-04-16 ENCOUNTER — Encounter: Payer: Self-pay | Admitting: Gastroenterology

## 2020-07-29 ENCOUNTER — Ambulatory Visit: Payer: No Typology Code available for payment source | Admitting: Obstetrics and Gynecology

## 2021-01-14 ENCOUNTER — Other Ambulatory Visit: Payer: Self-pay

## 2021-01-14 ENCOUNTER — Encounter: Payer: Self-pay | Admitting: Physician Assistant

## 2021-01-14 ENCOUNTER — Ambulatory Visit (INDEPENDENT_AMBULATORY_CARE_PROVIDER_SITE_OTHER): Payer: No Typology Code available for payment source | Admitting: Physician Assistant

## 2021-01-14 VITALS — BP 128/70 | HR 82 | Temp 98.3°F | Resp 14 | Ht 63.5 in | Wt 135.0 lb

## 2021-01-14 DIAGNOSIS — M818 Other osteoporosis without current pathological fracture: Secondary | ICD-10-CM | POA: Diagnosis not present

## 2021-01-14 DIAGNOSIS — E039 Hypothyroidism, unspecified: Secondary | ICD-10-CM

## 2021-01-14 DIAGNOSIS — K219 Gastro-esophageal reflux disease without esophagitis: Secondary | ICD-10-CM | POA: Diagnosis not present

## 2021-01-14 DIAGNOSIS — T783XXA Angioneurotic edema, initial encounter: Secondary | ICD-10-CM

## 2021-01-14 NOTE — Patient Instructions (Signed)
Pleasure meeting you today. You should be called about referral to allergist. See you in 6 months to discuss your thyroid and osteoporosis.   Angioedema Angioedema is swelling in the body. The swelling can occur in any part of the body. It often happens on the skin. It may cause itchy, bumpy patches (hives) to form. This condition may:  Occur only one time.  Happen more than one time. It can also stop at any time.  Keep coming back for a number of years. Someday it may stop. What are the causes? This condition may be caused by:  Foods, such as milk, eggs, shellfish, wheat, or nuts.  Medicines, such as ACE inhibitors, antibiotics, NSAIDs, birth control pills, or dyes used in X-rays. Hereditary angioedema (HAE) is passed from parent to child. Symptoms can occur because of:  Illness, infection, or stress.  Changes in hormones.  Exercise.  Minor surgery.  Dental work. In some cases, the cause of this condition may not be known. What increases the risk? You are more likely to have HAE if you have family members with this condition. What are the signs or symptoms? Symptoms of this condition include:  Swollen skin.  Red, itchy patches of skin.  Pain, pressure, or tenderness in the affected area.  Swollen eyelids, face, lips, or tongue.  Wheezing.  Trouble drinking, swallowing, or closing the mouth completely.  Being hoarse or having a sore throat.  Problems breathing. If your organs are affected:  You may feel like vomiting.  You may have pain in your belly.  You may vomit or have watery poop (diarrhea).  You may have trouble swallowing.  You may have trouble peeing.   How is this treated? To treat this condition, you may be told:  To avoid things that cause attacks (triggers). These include foods or things that cause allergies.  To stop medicines that cause the condition.  To take medicines to treat the condition. In very bad cases, a breathing tube or  a machine that helps with breathing (ventilator) may be used. Follow these instructions at home:  Take all medicines only as told by your doctor.  If you were given medicines to treat allergies, always carry them with you.  Wear a medical bracelet as told by your doctor.  Avoid the things that cause attacks. These may include: ? Foods. ? Things in your environment (such as pollen). ? Stress. ? Exercise.  Avoid all medicines that caused the attacks.  Talk to your doctor before you have kids. Some types of this condition may be passed from parent to child.   Where to find more information  American Academy of Allergy Asthma & Immunology: www.aaaai.org Contact a doctor if:  You have another attack.  Your attacks happen more often, even after you take steps to prevent them.  Your attacks are worse every time they occur.  This condition was passed to you by your parents and you want to have kids. Get help right away if:  Your mouth, tongue, or lips get very swollen.  Your swelling becomes worse.  You have trouble breathing.  You have trouble swallowing.  You have trouble talking.  You have chest pain or you feel dizzy.  You faint. These symptoms may be an emergency. Do not wait to see if the symptoms will go away. Get help right away. Call your local emergency services (911 in the [place] Do not drive yourself to the hospital. Summary  Angioedema is swelling that can happen in  any part of the body.  It can be caused by the food you eat or the medicines you are taking. It can also be passed from parent to child.  Avoid the things that cause your attacks. These can be food, medicines, or things in your environment.  If you were given medicines for allergies, always carry them with you.  Get help right away if your mouth, tongue, or lips get swollen. Also, get help right away if you have trouble breathing or swallowing. This information is not intended to replace  advice given to you by your health care provider. Make sure you discuss any questions you have with your health care provider. Document Revised: 10/23/2019 Document Reviewed: 10/23/2019 Elsevier Patient Education  2021 [place].    Osteoporosis  Osteoporosis happens when the bones become thin and less dense than normal. Osteoporosis makes bones more brittle and fragile and more likely to break (fracture). Over time, osteoporosis can cause your bones to become so weak that they fracture after a minor fall. Bones in the hip, wrist, and spine are most likely to fracture due to osteoporosis. What are the causes? The exact cause of this condition is not known. What increases the risk? You are more likely to develop this condition if you:  Have family members with this condition.  Have poor nutrition.  Use the following: ? Steroid medicines, such as prednisone. ? Anti-seizure medicines. ? Nicotine or tobacco, such as cigarettes, e-cigarettes, and chewing tobacco.  Are female.  Are age 60 or older.  Are not physically active (are sedentary).  Are of European or Asian descent.  Have a small body frame. What are the signs or symptoms? A fracture might be the first sign of osteoporosis, especially if the fracture results from a fall or injury that usually would not cause a bone to break. Other signs and symptoms include:  Pain in the neck or low back.  Stooped posture.  Loss of height. How is this diagnosed? This condition may be diagnosed based on:  Your medical history.  A physical exam.  A bone mineral density test, also called a DXA or DEXA test (dual-energy X-ray absorptiometry test). This test uses X-rays to measure the amount of minerals in your bones. How is this treated? This condition may be treated by:  Making lifestyle changes, such as: ? Including foods with more calcium and vitamin D in your diet. ? Doing weight-bearing and muscle-strengthening  exercises. ? Stopping tobacco use. ? Limiting alcohol intake.  Taking medicine to slow the process of bone loss or to increase bone density.  Taking daily supplements of calcium and vitamin D.  Taking hormone replacement medicines, such as estrogen for women and testosterone for men.  Monitoring your levels of calcium and vitamin D. The goal of treatment is to strengthen your bones and lower your risk for a fracture. Follow these instructions at home: Eating and drinking Include calcium and vitamin D in your diet. Calcium is important for bone health, and vitamin D helps your body absorb calcium. Good sources of calcium and vitamin D include:  Certain fatty fish, such as salmon and tuna.  Products that have calcium and vitamin D added to them (are fortified), such as fortified cereals.  Egg yolks.  Cheese.  Liver.   Activity Do exercises as told by your health care provider. Ask your health care provider what exercises and activities are safe for you. You should do:  Exercises that make you work against gravity (weight-bearing  exercises), such as tai chi, yoga, or walking.  Exercises to strengthen muscles, such as lifting weights. Lifestyle  Do not drink alcohol if: ? Your health care provider tells you not to drink. ? You are pregnant, may be pregnant, or are planning to become pregnant.  If you drink alcohol: ? Limit how much you use to:  0-1 drink a day for women.  0-2 drinks a day for men.  Know how much alcohol is in your drink. In the [place] one drink equals one 12 oz bottle of beer (355 mL), one 5 oz glass of wine (148 mL), or one 1 oz glass of hard liquor (44 mL).  Do not use any products that contain nicotine or tobacco, such as cigarettes, e-cigarettes, and chewing tobacco. If you need help quitting, ask your health care provider. Preventing falls  Use devices to help you move around (mobility aids) as needed, such as canes, walkers, scooters, or  crutches.  Keep rooms well-lit and clutter-free.  Remove tripping hazards from walkways, including cords and throw rugs.  Install grab bars in bathrooms and safety rails on stairs.  Use rubber mats in the bathroom and other areas that are often wet or slippery.  Wear closed-toe shoes that fit well and support your feet. Wear shoes that have rubber soles or low heels.  Review your medicines with your health care provider. Some medicines can cause dizziness or changes in blood pressure, which can increase your risk of falling. General instructions  Take over-the-counter and prescription medicines only as told by your health care provider.  Keep all follow-up visits. This is important. Contact a health care provider if:  You have never been screened for osteoporosis and you are: ? A woman who is age 14 or older. ? A man who is age 45 or older. Get help right away if:  You fall or injure yourself. Summary  Osteoporosis is thinning and loss of density in your bones. This makes bones more brittle and fragile and more likely to break (fracture),even with minor falls.  The goal of treatment is to strengthen your bones and lower your risk for a fracture.  Include calcium and vitamin D in your diet. Calcium is important for bone health, and vitamin D helps your body absorb calcium.  Talk with your health care provider about screening for osteoporosis if you are a woman who is age 57 or older, or a man who is age 62 or older. This information is not intended to replace advice given to you by your health care provider. Make sure you discuss any questions you have with your health care provider. Document Revised: 05/01/2020 Document Reviewed: 05/01/2020 Elsevier Patient Education  [place].

## 2021-01-14 NOTE — Progress Notes (Signed)
New Patient Office Visit  Subjective:  Patient ID: Whitney Lawrence, female    DOB: [birth year]  Age: 60 y.o. MRN: [id number]  CC:  Chief Complaint  Patient presents with  . Establish Care  . Oral Swelling    Started about 1 year ago. Was seen at urgent care given Doxycycline for treatment. Has flared back up about 4 months ago. She has seen Tela-doc who has diagnosed it as angioedema of the lip. Started Zyrtec with no improvement    HPI [name] presents for new patient establishment. 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 This is her second flare in about one year. The first time urgent care treated her for rosacea with doxycycline, which did not help and only upset her stomach. Now she has been taking Zyrtec without any change, except the swelling has resolved. States her lips are staying dried, cracking, painful, and itching.   Sees [facility] for female care. Sees Endocrinology annually for her hypothyroidism.  She is UTD with vision and dental care.    Past Medical History:  Diagnosis Date  . Allergy   . Anemia   . Elevated alkaline phosphatase level   . Elevated glucose level   . Elevated LDL cholesterol level   . Elevated serum creatinine   . GERD (gastroesophageal reflux disease)   . Hypothyroidism   . Osteoporosis   . Rosacea   . Thyroid disease    hypothyroidism  . Urinary incontinence     Past Surgical History:  Procedure Laterality Date  . CERVICAL BIOPSY  W/ LOOP ELECTRODE EXCISION    . CHOLECYSTECTOMY    . COLONOSCOPY W/ BIOPSIES N/A 2019   Polyps, next one due in 5 years  . COLPOSCOPY    . ESOPHAGOGASTRODUODENOSCOPY (EGD) WITH PROPOFOL N/A 04/14/2020   Procedure: ESOPHAGOGASTRODUODENOSCOPY (EGD) WITH PROPOFOL;  Surgeon: Midge Minium, MD;  Location[facility];  Service: Endoscopy;  Laterality: N/A;  priority 3  . Gum Grafts    . GYNECOLOGIC  CRYOSURGERY    . TUBAL LIGATION  1993  . URETHRAL SLING      Family History  Problem Relation Age of Onset  . Hyperlipidemia Mother   . Hypertension Mother   . Gout Father   . Arthritis Father   . Prostate cancer Paternal Grandfather     Social History   Socioeconomic History  . Marital status: Married    Spouse name: Not on file  . Number of children: Not on file  . Years of education: Not on file  . Highest education level: Not on file  Occupational History  . Not on file  Tobacco Use  . Smoking status: Never Smoker  . Smokeless tobacco: Never Used  Vaping Use  . Vaping Use: Never used  Substance and Sexual Activity  . Alcohol use: Not Currently  . Drug use: Never  . Sexual activity: Yes    Birth control/protection: Surgical    Comment: tubal ligation  Other Topics Concern  . Not on file  Social History Narrative  . Not on file   Social Determinants of Health   Financial Resource Strain: Not on file  Food Insecurity: Not on file  Transportation Needs: Not on file  Physical Activity: Not on file  Stress: Not on file  Social Connections: Not on file  Intimate Partner Violence: Not on file    ROS Review of Systems  Constitutional: Negative for  activity change, appetite change, fever and unexpected weight change.  HENT: Negative for congestion.   Eyes: Negative for visual disturbance.  Respiratory: Negative for apnea, cough and shortness of breath.   Cardiovascular: Negative for chest pain, palpitations and leg swelling.  Gastrointestinal: Negative for abdominal pain, blood in stool, constipation and diarrhea.  Endocrine: Negative for polydipsia, polyphagia and polyuria.  Genitourinary: Negative for dysuria and pelvic pain.  Musculoskeletal: Negative for arthralgias.  Skin: Negative for rash.  Neurological: Negative for dizziness, weakness and headaches.  Hematological: Negative for adenopathy. Does not bruise/bleed easily.  Psychiatric/Behavioral:  Negative for sleep disturbance and suicidal ideas. The patient is not nervous/anxious.     Objective:   Today's Vitals: BP 128/70   Pulse 82   Temp 98.3 F (36.8 C) (Temporal)   Resp 14   Ht 5' 3.5" (1.613 m)   Wt 135 lb (61.2 kg)   LMP 12/13/2016   SpO2 100%   BMI 23.54 kg/m   Physical Exam Vitals and nursing note reviewed.  Constitutional:      Appearance: Normal appearance. She is normal weight. She is not toxic-appearing.  HENT:     Head: Normocephalic and atraumatic.     Right Ear: Tympanic membrane, ear canal and external ear normal.     Left Ear: Tympanic membrane, ear canal and external ear normal.     Nose: Nose normal.     Mouth/Throat:     Mouth: Mucous membranes are moist.  Eyes:     Extraocular Movements: Extraocular movements intact.     Conjunctiva/sclera: Conjunctivae normal.     Pupils: Pupils are equal, round, and reactive to light.  Cardiovascular:     Rate and Rhythm: Normal rate and regular rhythm.     Pulses: Normal pulses.     Heart sounds: Normal heart sounds.  Pulmonary:     Effort: Pulmonary effort is normal.     Breath sounds: Normal breath sounds.  Abdominal:     General: Abdomen is flat. Bowel sounds are normal.     Palpations: Abdomen is soft.  Musculoskeletal:        General: Normal range of motion.     Cervical back: Normal range of motion and neck supple.  Skin:    General: Skin is warm and dry.     Comments: LIPS ARE NOT SWOLLEN BUT VERY DRY AND CRACKING  Neurological:     General: No focal deficit present.     Mental Status: She is alert and oriented to person, place, and time.  Psychiatric:        Mood and Affect: Mood normal.        Behavior: Behavior normal.        Thought Content: Thought content normal.        Judgment: Judgment normal.     Assessment & Plan:   Problem List Items Addressed This Visit      Endocrine   Hypothyroidism    Other Visit Diagnoses    Idiopathic angioedema, initial encounter    -   Primary   Relevant Orders   Ambulatory referral to Allergy   Gastroesophageal reflux disease without esophagitis       Other osteoporosis without current pathological fracture          Outpatient Encounter Medications as of 01/14/2021  Medication Sig  . cetirizine (ZYRTEC) 10 MG tablet Take 1 tablet by mouth 2 (two) times daily.  Marland Kitchen levothyroxine (SYNTHROID, LEVOTHROID) 50 MCG tablet Take 50 mcg by mouth  daily before breakfast.  . Multiple Vitamin (MULTIVITAMIN) tablet Take 1 tablet by mouth daily.  . [DISCONTINUED] Multiple Vitamins-Minerals (ZINC PO) Take by mouth daily.  . [DISCONTINUED] cyclobenzaprine (FLEXERIL) 10 MG tablet cyclobenzaprine 10 mg tablet  TAKE 1 TABLET BY MOUTH EVERY DAY AT BEDTIME  . [DISCONTINUED] doxycycline (VIBRAMYCIN) 100 MG capsule doxycycline hyclate 100 mg capsule  . [DISCONTINUED] pantoprazole (PROTONIX) 40 MG tablet Take 1 tablet (40 mg total) by mouth daily.   No facility-administered encounter medications on file as of 01/14/2021.    Follow-up: Return in about 6 months (around 07/14/2021) for thyroid check.   1. Idiopathic angioedema, initial encounter  Refer to allergist for testing.  2. Hypothyroidism, unspecified type She has been stable on Levothyroxine 50 mcg daily. Offered to manage her care for this and monitor her TSH levels annually.   3. Gastroesophageal reflux disease without esophagitis Stable, Protonix 40 mg daily.  4. Other osteoporosis without current pathological fracture Pt states she was diagnosed by her GYN office and has been taking Tums for this. I will try to get report on this.  Shaketha Jeon M Avyukth Bontempo, PA-C

## 2021-01-17 IMAGING — US US ABDOMEN LIMITED
1 series · 14 of 25 positions shown · non-contrast
Comparison: None.

CLINICAL DATA: Elevated liver enzymes

EXAM:
ULTRASOUND ABDOMEN LIMITED RIGHT UPPER QUADRANT

[Series 1: us abdomen limited · 0.17mm/px · 14 of 38 slices shown]
[im 1/38]
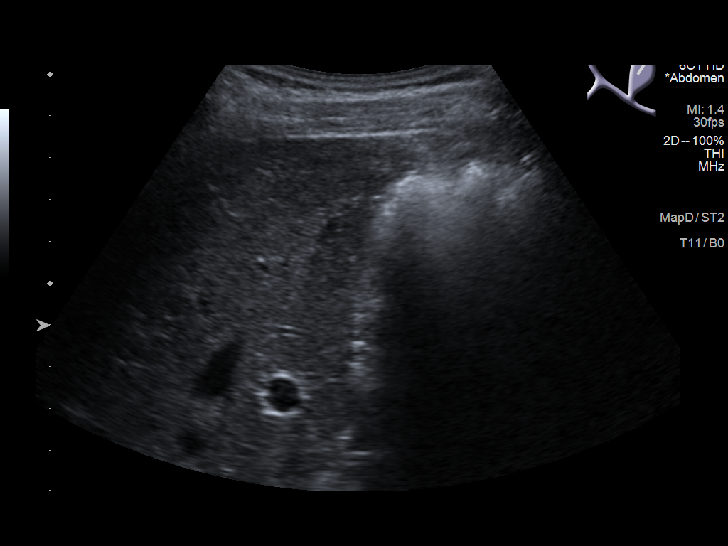
[im 4/38]
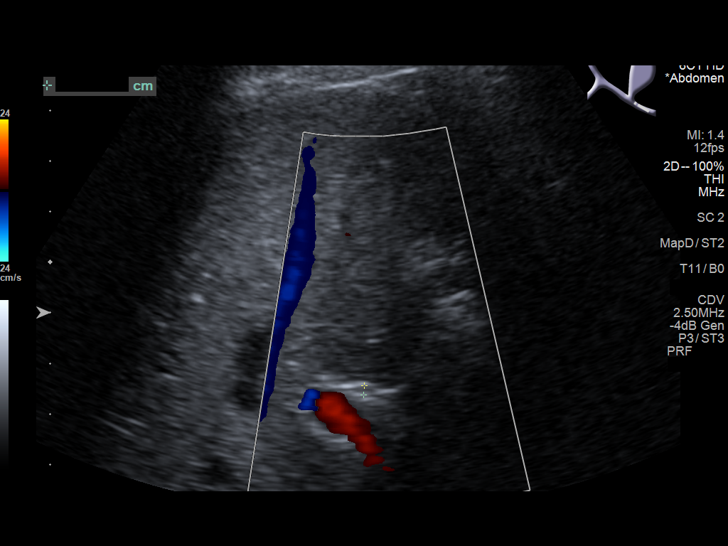
[im 7/38]
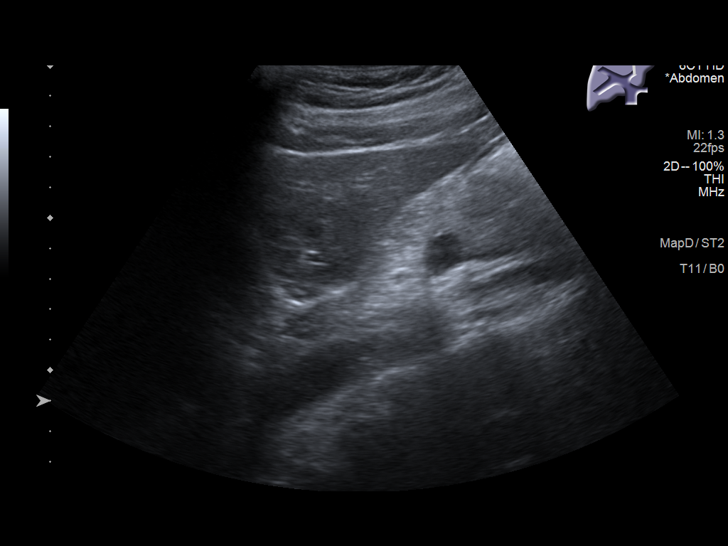
[im 10/38]
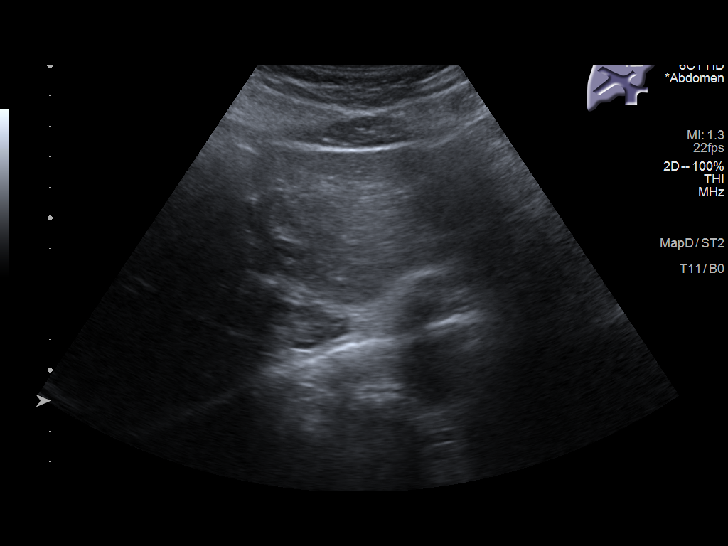
[im 13/38]
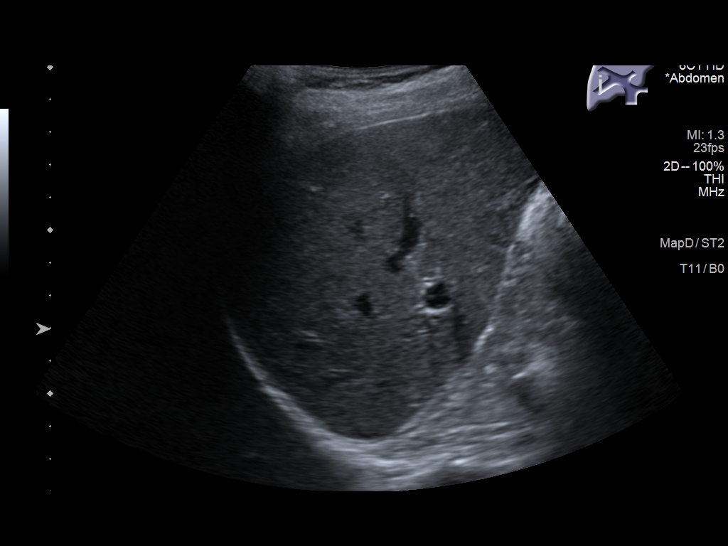
[im 14/38]
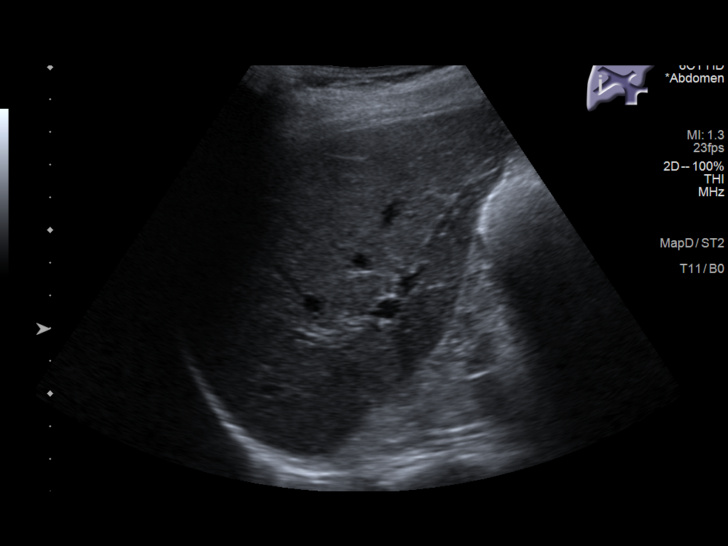
[im 17/38]
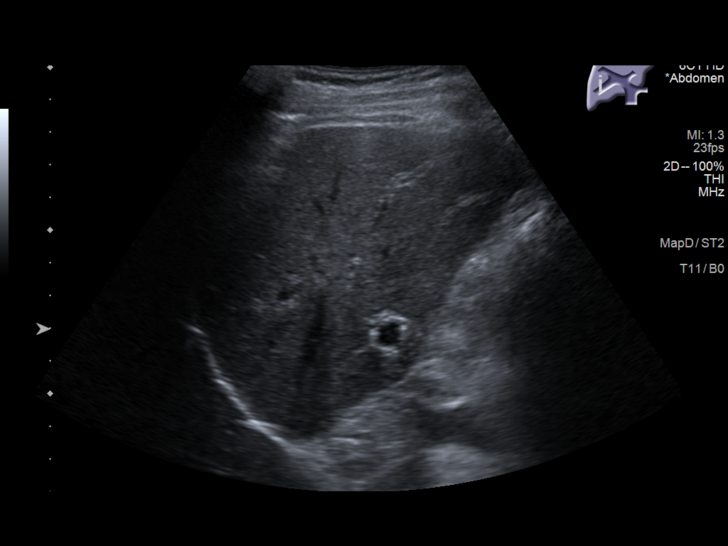
[im 21/38]
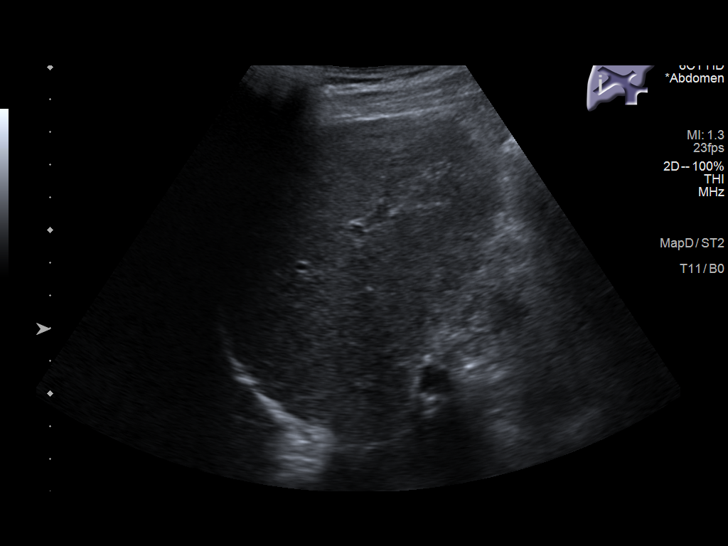
[im 24/38]
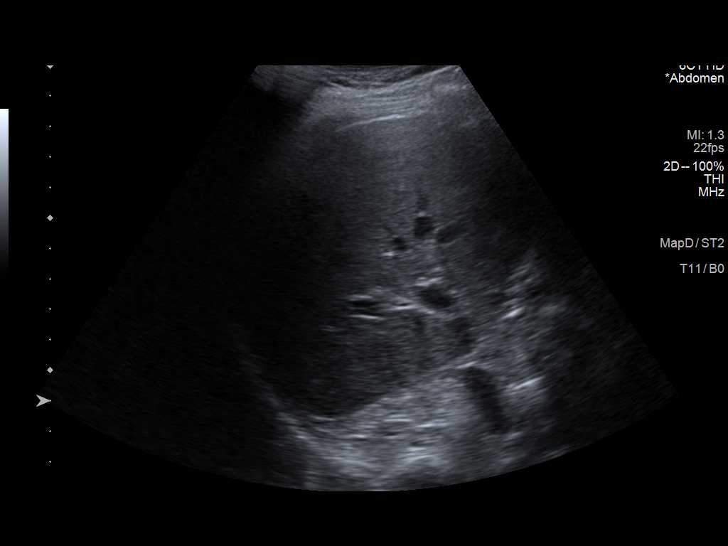
[im 25/38]
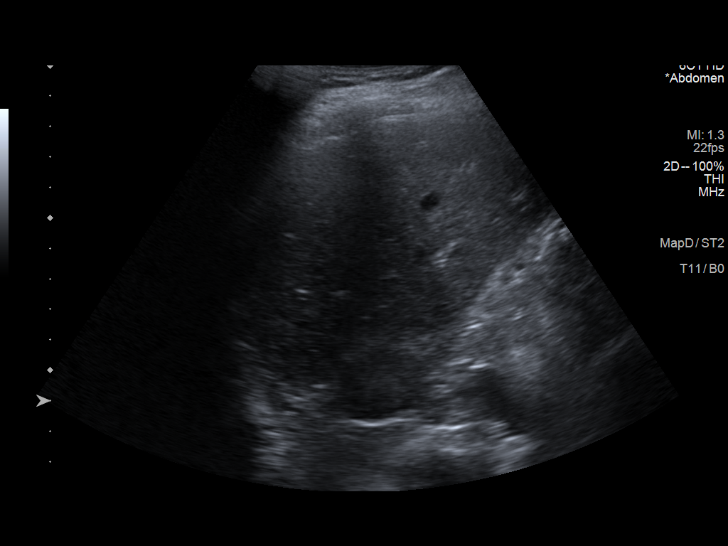
[im 28/38]
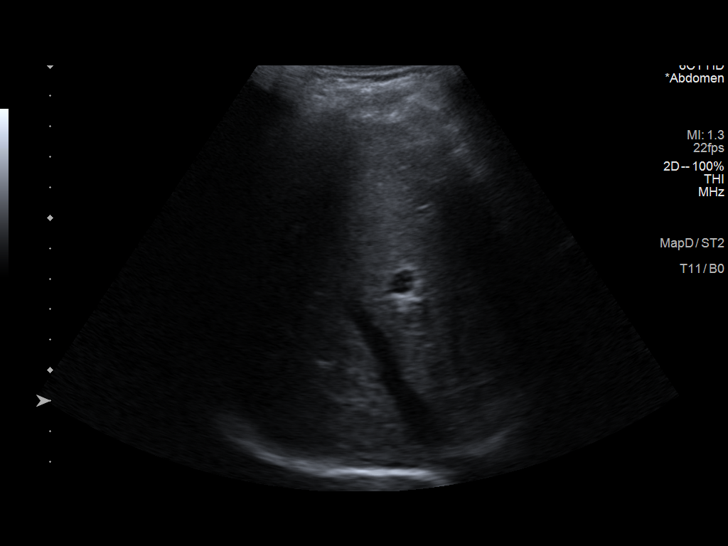
[im 31/38]
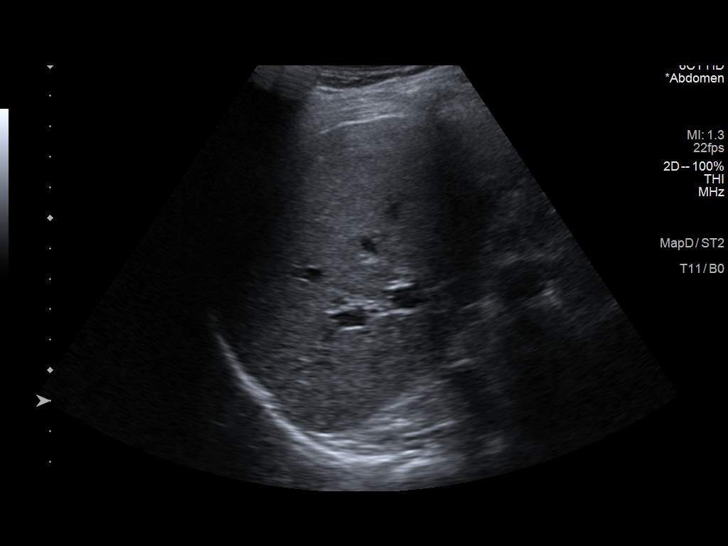
[im 34/38]
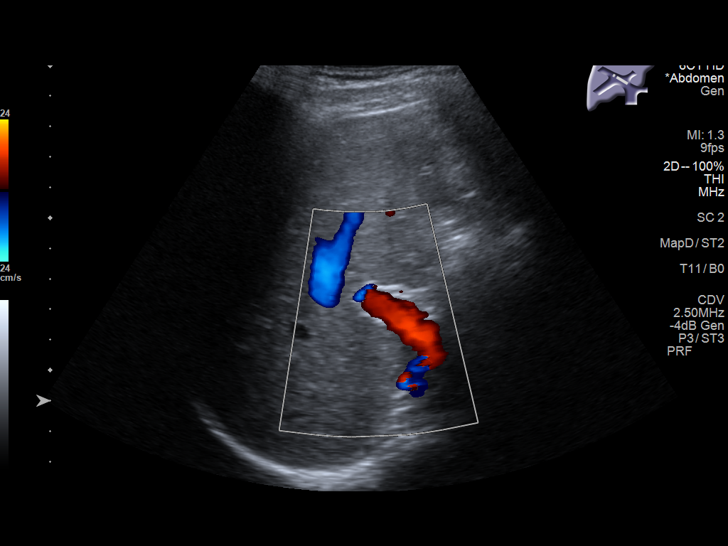
[im 38/38]
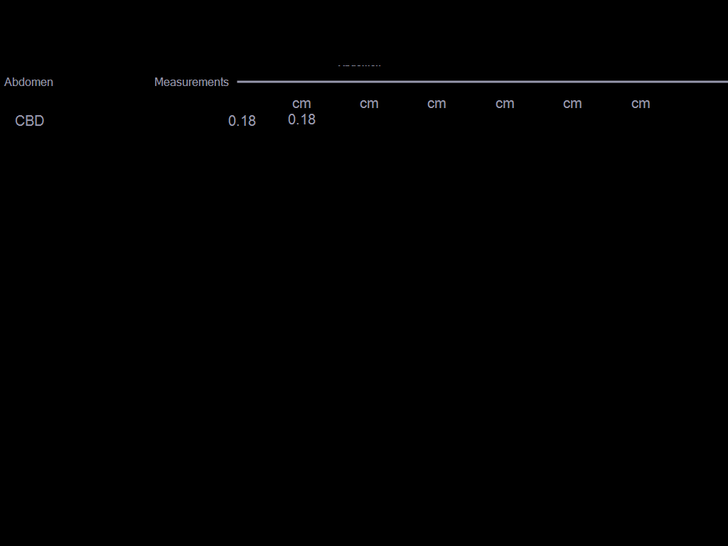

[14 of 25 positions shown; findings below may reference images not displayed]

FINDINGS: Gallbladder:

Prior cholecystectomy

Common bile duct:

Diameter: Normal caliber, 2 mm

Liver:

No focal lesion identified. Within normal limits in parenchymal
echogenicity. Portal vein is patent on color Doppler imaging with
normal direction of blood flow towards the liver.

Other: None.
IMPRESSION: Prior cholecystectomy.  No acute findings.

## 2021-02-03 ENCOUNTER — Other Ambulatory Visit: Payer: Self-pay

## 2021-02-03 ENCOUNTER — Encounter: Payer: Self-pay | Admitting: Allergy

## 2021-02-03 ENCOUNTER — Ambulatory Visit (INDEPENDENT_AMBULATORY_CARE_PROVIDER_SITE_OTHER): Payer: No Typology Code available for payment source | Admitting: Allergy

## 2021-02-03 VITALS — BP 116/70 | HR 86 | Temp 97.6°F | Resp 16 | Ht 64.0 in | Wt 140.5 lb

## 2021-02-03 DIAGNOSIS — T783XXD Angioneurotic edema, subsequent encounter: Secondary | ICD-10-CM

## 2021-02-03 DIAGNOSIS — L309 Dermatitis, unspecified: Secondary | ICD-10-CM | POA: Diagnosis not present

## 2021-02-03 NOTE — Progress Notes (Signed)
New Patient Note  RE: Whitney Lawrence MRN: [id number] DOB: [birth year] Date of Office Visit: 02/03/2021  Referring provider: Allwardt, Crist Infante, PA-C Primary care provider: Allwardt, Crist Infante, PA-C  Chief Complaint: Angioedema (Lips swell and peel)  History of Present Illness: I had the pleasure of seeing [name] for initial evaluation at the [facility] on 02/04/2021. She is a 60 y.o. female, who is referred here by Allwardt, Crist Infante, PA-C for the evaluation of angioedema.  Lip swelling started about 1 year ago. Patient has a sensation of itching/tingling/burning before the lips start to swell. The swelling lasts from a few days to 1 week at a time.   She had 4 total episodes to date. Current episode started 1-2 days ago. The swelling patient states is not very distinguishable. Denies any respiratory compromise.   Associated symptoms include: none. Suspected triggers are unknown. Denies any fevers, chills, changes in medications, foods, personal care products or recent infections. She has tried the following therapies: doxycycline with no benefit; zyrtec with no benefit. Cortisone cream with some benefit. Using Carmax chapstick.   Systemic steroids no. Currently on no daily medications.  Previous work up includes: none. Previous history of angioedema: none. Patient is up to date with the following cancer screening tests: colnoscopy, pap smears, mammogram. No family history of angioedema.  01/14/2021 PCP visit: "Whitney Lawrence presents for new patient establishment. 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 This is her second flare in about one year. The first time urgent care treated her for rosacea with doxycycline, which did not help and only upset her stomach. Now she has been taking Zyrtec without any change, except the swelling has resolved. States her lips are staying dried, cracking,  painful, and itching. "  Assessment and Plan: Surie is a 60 y.o. female with: Dermatitis of lip 4 episodes of "lip swelling" with lip peeling for the past 1 year - lasting few days to 1 week at a time. Denies any changes in diet, meds or personal care products during this time. She has been using Carmax chapstick. Patient states she is having an episode right now - difficult to distinguish any notable swelling today on exam. Concerned about allergies including alpha gal - patient does eat red meat 1-2 times per week.  Based on clinical history and presentation more concerning for a contact dermatitis irritation to the lips.   Keep track of episodes and take pictures.  Write down what you had eaten/come across that day.  Avoid red meat for now - until bloodwork is back.  Avoid spicy foods, acidic foods, alcohol which can be irritating to the skin.  Use only plain Vaseline ointment or Vanicream ointment on the lips - samples given.   Get bloodwork to rule out other etiologies.   If bloodwork is negative and still having issues then will recommend patch testing next.  Return if symptoms worsen or fail to improve.  No orders of the defined types were placed in this encounter.   Lab Orders     CBC with Differential/Platelet     Alpha-Gal Panel     Comprehensive metabolic panel     C1 Esterase Inhibitor     C3 and C4     C1 esterase inhibitor, functional     Complement component c1q     C-reactive protein     Tryptase     Sedimentation rate  Other allergy screening: Asthma: no Rhino conjunctivitis: yes  Mild rhinitis symptoms during the spring and fall. Takes OTC antihistamines with good benefit.  Food allergy: no   Patient eats red meat 1-2 times per week.   Medication allergy: no Hymenoptera allergy: no Urticaria: no Eczema:no History of recurrent infections suggestive of immunodeficency: no  Diagnostics: Skin Testing: None.  Past Medical History: Patient Active  Problem List   Diagnosis Date Noted   Dermatitis of lip 02/04/2021   Heartburn    Other specified diseases of esophagus    Hypothyroidism 01/29/2019   Past Medical History:  Diagnosis Date   Allergy    Anemia    Elevated alkaline phosphatase level    Elevated glucose level    Elevated LDL cholesterol level    Elevated serum creatinine    GERD (gastroesophageal reflux disease)    Hypothyroidism    Osteoporosis    Rosacea    Thyroid disease    hypothyroidism   Urinary incontinence    Past Surgical History: Past Surgical History:  Procedure Laterality Date   CERVICAL BIOPSY  W/ LOOP ELECTRODE EXCISION     CHOLECYSTECTOMY     COLONOSCOPY W/ BIOPSIES N/A 2019   Polyps, next one due in 5 years   COLPOSCOPY     ESOPHAGOGASTRODUODENOSCOPY (EGD) WITH PROPOFOL N/A 04/14/2020   Procedure: ESOPHAGOGASTRODUODENOSCOPY (EGD) WITH PROPOFOL;  Surgeon: Midge MiniumWohl, Darren, MD;  [facility];  Service: Endoscopy;  Laterality: N/A;  priority 3   Gum Grafts     GYNECOLOGIC CRYOSURGERY     TUBAL LIGATION  1993   URETHRAL SLING     Medication List:  Current Outpatient Medications  Medication Sig Dispense Refill   cetirizine (ZYRTEC) 10 MG tablet Take 1 tablet by mouth 2 (two) times daily.     fexofenadine (ALLEGRA) 180 MG tablet Take 180 mg by mouth daily.     levothyroxine (SYNTHROID, LEVOTHROID) 50 MCG tablet Take 50 mcg by mouth daily before breakfast.     Multiple Vitamin (MULTIVITAMIN) tablet Take 1 tablet by mouth daily.     No current facility-administered medications for this visit.   Allergies: Allergies  Allergen Reactions   Boniva [Ibandronic Acid]     Flu like symptoms   Adhesive [Tape] Rash    Some bandaids   Social History: Social History   Socioeconomic History   Marital status: Married    Spouse name: Not on file   Number of children: Not on file   Years of education: Not on file   Highest education level: Not on  file  Occupational History   Not on file  Tobacco Use   Smoking status: Never Smoker   Smokeless tobacco: Never Used  Vaping Use   Vaping Use: Never used  Substance and Sexual Activity   Alcohol use: Not Currently   Drug use: Never   Sexual activity: Yes    Birth control/protection: Surgical    Comment: tubal ligation  Other Topics Concern   Not on file  Social History Narrative   Not on file   Social Determinants of Health   Financial Resource Strain: Not on file  Food Insecurity: Not on file  Transportation Needs: Not on file  Physical Activity: Not on file  Stress: Not on file  Social Connections: Not on file   [place] in a renovated factory. Smoking: denies Occupation: surgical coordinator  Environmental History: Water Damage/mildew in the house: no Carpet in the family room: no Carpet in the bedroom: no  Heating: gas Cooling: heat pump Pet: no  Family History: Family History  Problem Relation Age of Onset   Hyperlipidemia Mother    Hypertension Mother    Gout Father    Arthritis Father    Prostate cancer Paternal Grandfather    Problem                               Relation Asthma                                   No  Eczema                                No  Food allergy                          No  Allergic rhino conjunctivitis     No   Review of Systems  Constitutional: Negative for appetite change, chills, fever and unexpected weight change.  HENT: Negative for congestion and rhinorrhea.   Eyes: Negative for itching.  Respiratory: Negative for cough, chest tightness, shortness of breath and wheezing.   Cardiovascular: Negative for chest pain.  Gastrointestinal: Negative for abdominal pain.  Genitourinary: Negative for difficulty urinating.  Skin: Negative for rash.  Neurological: Negative for headaches.   Objective: BP 116/70    Pulse 86    Temp 97.6 F (36.4 C) (Temporal)    Resp 16    Ht 5\' 4"  (1.626 m)    Wt 140 lb 8 oz (63.7  kg)    LMP 12/13/2016    SpO2 100%    BMI 24.12 kg/m  Body mass index is 24.12 kg/m. Physical Exam Vitals and nursing note reviewed.  Constitutional:      Appearance: Normal appearance. She is well-developed.  HENT:     Head: Normocephalic and atraumatic.     Right Ear: Tympanic membrane and external ear normal.     Left Ear: Tympanic membrane and external ear normal.     Nose: Nose normal.     Mouth/Throat:     Mouth: Mucous membranes are moist.     Pharynx: Oropharynx is clear.     Comments: No distinguishable swelling visible during physical exam of the lips. Eyes:     Conjunctiva/sclera: Conjunctivae normal.  Cardiovascular:     Rate and Rhythm: Normal rate and regular rhythm.     Heart sounds: Normal heart sounds. No murmur heard. No friction rub. No gallop.   Pulmonary:     Effort: Pulmonary effort is normal.     Breath sounds: Normal breath sounds. No wheezing, rhonchi or rales.  Musculoskeletal:     Cervical back: Neck supple.  Skin:    General: Skin is warm.     Findings: No rash.  Neurological:     Mental Status: She is alert and oriented to person, place, and time.  Psychiatric:        Behavior: Behavior normal.    The plan was reviewed with the patient/family, and all questions/concerned were addressed.  It was my pleasure to see Whitney Lawrence today and participate in her care. Please feel free to contact me with any questions or concerns.  Sincerely,  Gunnar Fusi, DO Allergy & Immunology  [facility]  [facility] office: [phone] [facility] office: [phone]

## 2021-02-03 NOTE — Patient Instructions (Addendum)
Lip:  Keep track of episodes and take pictures.  Write down what you had eaten/come across that day.  Avoid red meat for now - until bloodwork is back.  Avoid spicy foods, acidic foods, alcohol which can be irritating to the skin.  Based on distribution concerning for contact irritation/dermatitis.   Use only plain vaseline ointment or vanicream ointment on the lips.  . Get bloodwork:  o We are ordering labs, so please allow 1-2 weeks for the results to come back. o With the newly implemented Cures Act, the labs might be visible to you at the same time that they become visible to me. However, I will not address the results until all of the results are back, so please be patient.  o In the meantime, continue recommendations in your patient instructions, including avoidance measures (if applicable), until you hear from me.  If bloodwork is negative and still having issues then will recommend patch testing next.  Follow up as needed.

## 2021-02-04 ENCOUNTER — Encounter: Payer: Self-pay | Admitting: Allergy

## 2021-02-04 DIAGNOSIS — L309 Dermatitis, unspecified: Secondary | ICD-10-CM | POA: Insufficient documentation

## 2021-02-04 NOTE — Assessment & Plan Note (Addendum)
4 episodes of "lip swelling" with lip peeling for the past 1 year - lasting few days to 1 week at a time. Denies any changes in diet, meds or personal care products during this time. She has been using Carmax chapstick. Patient states she is having an episode right now - difficult to distinguish any notable swelling today on exam. Concerned about allergies including alpha gal - patient does eat red meat 1-2 times per week.  Based on clinical history and presentation more concerning for a contact dermatitis irritation to the lips.   Keep track of episodes and take pictures.  Write down what you had eaten/come across that day.  Avoid red meat for now - until bloodwork is back.  Avoid spicy foods, acidic foods, alcohol which can be irritating to the skin.  Use only plain Vaseline ointment or Vanicream ointment on the lips - samples given.  . Get bloodwork to rule out other etiologies.   If bloodwork is negative and still having issues then will recommend patch testing next.

## 2021-02-09 LAB — ALPHA-GAL PANEL
Allergen Lamb IgE: <0.1 kU/L
Beef IgE: <0.1 kU/L
IgE (Immunoglobulin E), Serum: 45 IU/mL (ref 6–495)
O215-IgE Alpha-Gal: <0.1 kU/L
Pork IgE: <0.1 kU/L

## 2021-02-09 LAB — CBC WITH DIFFERENTIAL/PLATELET
Basophils Absolute: 0.1 10*3/uL (ref 0.0–0.2)
Basos: 1 %
EOS (ABSOLUTE): 0.2 10*3/uL (ref 0.0–0.4)
Eos: 5 %
Hematocrit: 37.1 % (ref 34.0–46.6)
Hemoglobin: 12.5 g/dL (ref 11.1–15.9)
Immature Grans (Abs): 0 10*3/uL (ref 0.0–0.1)
Immature Granulocytes: 0 %
Lymphocytes Absolute: 1.6 10*3/uL (ref 0.7–3.1)
Lymphs: 38 %
MCH: 29.6 pg (ref 26.6–33.0)
MCHC: 33.7 g/dL (ref 31.5–35.7)
MCV: 88 fL (ref 79–97)
Monocytes Absolute: 0.7 10*3/uL (ref 0.1–0.9)
Monocytes: 16 %
Neutrophils Absolute: 1.8 10*3/uL (ref 1.4–7.0)
Neutrophils: 40 %
Platelets: 283 10*3/uL (ref 150–450)
RBC: 4.23 x10E6/uL (ref 3.77–5.28)
RDW: 12.1 % (ref 11.7–15.4)
WBC: 4.4 10*3/uL (ref 3.4–10.8)

## 2021-02-09 LAB — COMPREHENSIVE METABOLIC PANEL
ALT: 31 IU/L (ref 0–32)
AST: 32 IU/L (ref 0–40)
Albumin/Globulin Ratio: 1.4 (ref 1.2–2.2)
Albumin: 4.2 g/dL (ref 3.8–4.9)
Alkaline Phosphatase: 127 IU/L — ABNORMAL HIGH (ref 44–121)
BUN/Creatinine Ratio: 20 (ref 9–23)
BUN: 18 mg/dL (ref 6–24)
Bilirubin Total: <0.2 mg/dL (ref 0.0–1.2)
CO2: 23 mmol/L (ref 20–29)
Calcium: 9 mg/dL (ref 8.7–10.2)
Chloride: 105 mmol/L (ref 96–106)
Creatinine, Ser: 0.92 mg/dL (ref 0.57–1.00)
Globulin, Total: 3 g/dL (ref 1.5–4.5)
Glucose: 84 mg/dL (ref 65–99)
Potassium: 4.4 mmol/L (ref 3.5–5.2)
Sodium: 142 mmol/L (ref 134–144)
Total Protein: 7.2 g/dL (ref 6.0–8.5)
eGFR: 72 mL/min/{1.73_m2} (ref >59)

## 2021-02-09 LAB — COMPLEMENT COMPONENT C1Q: Complement C1Q: 13 mg/dL (ref 10.3–20.5)

## 2021-02-09 LAB — TRYPTASE: Tryptase: 7 ug/L (ref 2.2–13.2)

## 2021-02-09 LAB — C-REACTIVE PROTEIN: CRP: <1 mg/L (ref 0–10)

## 2021-02-09 LAB — C1 ESTERASE INHIBITOR: C1INH SerPl-mCnc: 33 mg/dL (ref 21–39)

## 2021-02-09 LAB — SEDIMENTATION RATE: Sed Rate: 17 mm/hr (ref 0–40)

## 2021-02-09 LAB — C3 AND C4
Complement C3, Serum: 125 mg/dL (ref 82–167)
Complement C4, Serum: 20 mg/dL (ref 12–38)

## 2021-02-09 LAB — C1 ESTERASE INHIBITOR, FUNCTIONAL: C1INH Functional/C1INH Total MFr SerPl: 96 %mean normal

## 2021-02-16 ENCOUNTER — Other Ambulatory Visit: Payer: Self-pay

## 2021-02-16 ENCOUNTER — Encounter: Payer: Self-pay | Admitting: Physician Assistant

## 2021-02-16 ENCOUNTER — Ambulatory Visit: Payer: No Typology Code available for payment source | Admitting: Physician Assistant

## 2021-02-16 VITALS — BP 119/64 | HR 69 | Temp 98.2°F | Ht 64.0 in | Wt 139.2 lb

## 2021-02-16 DIAGNOSIS — E039 Hypothyroidism, unspecified: Secondary | ICD-10-CM

## 2021-02-16 LAB — TSH: TSH: 0.78 u[IU]/mL (ref 0.35–4.50)

## 2021-02-16 NOTE — Patient Instructions (Signed)
Go to lab for TSH check today. Will adjust medication if needed. See you back in one year, sooner if any concerns.  Have a great week!

## 2021-02-16 NOTE — Progress Notes (Signed)
Established Patient Office Visit  Subjective:  Patient ID: Whitney Lawrence, female    DOB: [birth year]  Age: 60 y.o. MRN: [id number]  CC:  Chief Complaint  Patient presents with  . Thyroid Problem     HPI ATONYA TEMPLER presents for TSH check. States it has been awhile since this was done and she wants to make sure her dose is correct. Currently taking Synthroid 50 mcg without any issues. Denies any constipation, fatigue, hair loss, excessive weight gain, or other issues.   Past Medical History:  Diagnosis Date  . Allergy   . Anemia   . Elevated alkaline phosphatase level   . Elevated glucose level   . Elevated LDL cholesterol level   . Elevated serum creatinine   . GERD (gastroesophageal reflux disease)   . Hypothyroidism   . Osteoporosis   . Rosacea   . Thyroid disease    hypothyroidism  . Urinary incontinence     Past Surgical History:  Procedure Laterality Date  . CERVICAL BIOPSY  W/ LOOP ELECTRODE EXCISION    . CHOLECYSTECTOMY    . COLONOSCOPY W/ BIOPSIES N/A 2019   Polyps, next one due in 5 years  . COLPOSCOPY    . ESOPHAGOGASTRODUODENOSCOPY (EGD) WITH PROPOFOL N/A 04/14/2020   Procedure: ESOPHAGOGASTRODUODENOSCOPY (EGD) WITH PROPOFOL;  Surgeon: Midge Minium, MD;  Location: [facility];  Service: Endoscopy;  Laterality: N/A;  priority 3  . Gum Grafts    . GYNECOLOGIC CRYOSURGERY    . TUBAL LIGATION  1993  . URETHRAL SLING      Family History  Problem Relation Age of Onset  . Hyperlipidemia Mother   . Hypertension Mother   . Gout Father   . Arthritis Father   . Prostate cancer Paternal Grandfather     Social History   Socioeconomic History  . Marital status: Married    Spouse name: Not on file  . Number of children: Not on file  . Years of education: Not on file  . Highest education level: Not on file  Occupational History  . Not on file  Tobacco Use  . Smoking status: Never Smoker  . Smokeless tobacco: Never Used  Vaping Use  .  Vaping Use: Never used  Substance and Sexual Activity  . Alcohol use: Not Currently  . Drug use: Never  . Sexual activity: Yes    Birth control/protection: Surgical    Comment: tubal ligation  Other Topics Concern  . Not on file  Social History Narrative  . Not on file   Social Determinants of Health   Financial Resource Strain: Not on file  Food Insecurity: Not on file  Transportation Needs: Not on file  Physical Activity: Not on file  Stress: Not on file  Social Connections: Not on file  Intimate Partner Violence: Not on file    Outpatient Medications Prior to Visit  Medication Sig Dispense Refill  . levothyroxine (SYNTHROID, LEVOTHROID) 50 MCG tablet Take 50 mcg by mouth daily before breakfast.    . Multiple Vitamin (MULTIVITAMIN) tablet Take 1 tablet by mouth daily.    . cetirizine (ZYRTEC) 10 MG tablet Take 1 tablet by mouth 2 (two) times daily.    . fexofenadine (ALLEGRA) 180 MG tablet Take 180 mg by mouth daily.     No facility-administered medications prior to visit.    Allergies  Allergen Reactions  . Boniva [Ibandronic Acid]     Flu like symptoms  . Adhesive [Tape] Rash    Some  bandaids    ROS Review of Systems REFER TO HPI FOR PERTINENT POSITIVES AND NEGATIVES    Objective:    Physical Exam Vitals and nursing note reviewed.  Cardiovascular:     Rate and Rhythm: Normal rate and regular rhythm.  Pulmonary:     Effort: Pulmonary effort is normal. No respiratory distress.     Breath sounds: Normal breath sounds.  Neurological:     General: No focal deficit present.     Mental Status: She is alert.     BP 119/64   Pulse 69   Temp 98.2 F (36.8 C)   Ht 5\' 4"  (1.626 m)   Wt 139 lb 3.2 oz (63.1 kg)   LMP 12/13/2016   SpO2 97%   BMI 23.89 kg/m  Wt Readings from Last 3 Encounters:  02/16/21 139 lb 3.2 oz (63.1 kg)  02/03/21 140 lb 8 oz (63.7 kg)  01/14/21 135 lb (61.2 kg)     Health Maintenance Due  Topic Date Due  . Hepatitis C  Screening  Never done  . COVID-19 Vaccine (1) Never done  . HIV Screening  Never done  . PAP SMEAR-Modifier  03/09/2021    There are no preventive care reminders to display for this patient.  No results found for: TSH Lab Results  Component Value Date   WBC 4.4 02/03/2021   HGB 12.5 02/03/2021   HCT 37.1 02/03/2021   MCV 88 02/03/2021   PLT 283 02/03/2021   Lab Results  Component Value Date   NA 142 02/03/2021   K 4.4 02/03/2021   CO2 23 02/03/2021   GLUCOSE 84 02/03/2021   BUN 18 02/03/2021   CREATININE 0.92 02/03/2021   BILITOT <0.2 02/03/2021   ALKPHOS 127 (H) 02/03/2021   AST 32 02/03/2021   ALT 31 02/03/2021   PROT 7.2 02/03/2021   ALBUMIN 4.2 02/03/2021   CALCIUM 9.0 02/03/2021   ANIONGAP 10 05/04/2018   Lab Results  Component Value Date   CHOL 207 (H) 07/24/2019   Lab Results  Component Value Date   HDL 78 07/24/2019   Lab Results  Component Value Date   LDLCALC 111 (H) 07/24/2019   Lab Results  Component Value Date   TRIG 92 07/24/2019   Lab Results  Component Value Date   CHOLHDL 2.7 07/24/2019   No results found for: HGBA1C    Assessment & Plan:   Problem List Items Addressed This Visit      Endocrine   Hypothyroidism - Primary   Relevant Orders   TSH      No orders of the defined types were placed in this encounter.   Follow-up: Return in about 1 year (around 02/16/2022) for TSH recheck .   1. Hypothyroidism, unspecified type Will check TSH today and adjust medication if needed.    Elmira Olkowski M Autumnrose Yore, PA-C

## 2021-02-17 ENCOUNTER — Encounter: Payer: Self-pay | Admitting: Physician Assistant

## 2021-02-19 ENCOUNTER — Other Ambulatory Visit: Payer: Self-pay

## 2021-02-19 MED ORDER — LEVOTHYROXINE SODIUM 50 MCG PO TABS: 50.0000 ug | ORAL_TABLET | Freq: Every day | ORAL | 1 refills | Status: DC

## 2021-03-17 ENCOUNTER — Encounter: Payer: Self-pay | Admitting: Physician Assistant

## 2021-04-05 NOTE — Progress Notes (Signed)
   Follow Up Note  RE: [name] MRN: [id number] DOB: October 07, 1961 Date of Office Visit: 04/06/2021  Referring provider: Allwardt, Crist Infante, PA-C Primary care provider: Allwardt, Crist Infante, PA-C  History of Present Illness: I had the pleasure of seeing Whitney Lawrence for a follow up visit at the [facility] on 04/06/2021. She is a 60 y.o. female, who is being followed for dermatitis. Today she is here for patch test placement, given suspected history of contact dermatitis.   Reintroduced red meat back into diet with no worsening symptoms.  Using vanicream on the lips with unknown benefit.  A few weeks ago had a bad flare up.  This is different than her cold sore flares.  Diagnostics: TRUE Test patches placed.   Assessment and Plan: Dezire is a 60 y.o. female with: Allergic contact dermatitis Past history - 4 episodes of "lip swelling" with lip peeling for the past 1 year - lasting few days to 1 week at a time. Denies any changes in diet, meds or personal care products during this time. She has been using Carmax chapstick. Patient states she is having an episode right now - difficult to distinguish any notable swelling today on exam. Concerned about allergies including alpha gal - patient does eat red meat 1-2 times per week. Interim history - bloodwork normal, eating red meat with no issues. This feels different than her usual cold sores.  . TRUE patches placed today.  Try fluoride free toothpaste.    The patient was instructed regarding proper care of the patches for the next 48 hours. Do not get patches wet - avoid showering until the next visit. Do not engage in vigorous physical activity.  Patient will follow up in 48 hours and 96 hours for patch readings.  It was my pleasure to see Whitney Lawrence today and participate in her care. Please feel free to contact me with any questions or concerns.  Sincerely,  Wyline Mood, DO Allergy & Immunology  [facility] office: [phone] [facility] office: [phone] [place] office: [phone]

## 2021-04-06 ENCOUNTER — Encounter: Payer: Self-pay | Admitting: Allergy

## 2021-04-06 ENCOUNTER — Ambulatory Visit (INDEPENDENT_AMBULATORY_CARE_PROVIDER_SITE_OTHER): Payer: No Typology Code available for payment source | Admitting: Allergy

## 2021-04-06 ENCOUNTER — Other Ambulatory Visit: Payer: Self-pay

## 2021-04-06 VITALS — BP 122/76 | HR 78 | Temp 98.1°F | Resp 18 | Ht 63.0 in | Wt 143.6 lb

## 2021-04-06 DIAGNOSIS — L239 Allergic contact dermatitis, unspecified cause: Secondary | ICD-10-CM | POA: Insufficient documentation

## 2021-04-06 NOTE — Patient Instructions (Signed)
.   Patches placed today. . Please avoid strenuous physical activities and do not get the patches on the back wet. No showering until final patch reading done. Molli Knock to take antihistamines for itching but avoid placing any creams on the back where the patches are. . We will remove the patches on Wednesday and will do our initial read. . Then you will come back on Friday for a final read.   Try fluoride free toothpaste.   Follow up in 1 month or sooner if needed.

## 2021-04-06 NOTE — Assessment & Plan Note (Signed)
Past history - 4 episodes of "lip swelling" with lip peeling for the past 1 year - lasting few days to 1 week at a time. Denies any changes in diet, meds or personal care products during this time. She has been using Carmax chapstick. Patient states she is having an episode right now - difficult to distinguish any notable swelling today on exam. Concerned about allergies including alpha gal - patient does eat red meat 1-2 times per week. Interim history - bloodwork normal, eating red meat with no issues. This feels different than her usual cold sores.  . TRUE patches placed today.  Try fluoride free toothpaste.

## 2021-04-08 ENCOUNTER — Ambulatory Visit: Payer: No Typology Code available for payment source | Admitting: Family Medicine

## 2021-04-08 ENCOUNTER — Other Ambulatory Visit: Payer: Self-pay

## 2021-04-08 ENCOUNTER — Encounter: Payer: Self-pay | Admitting: Family Medicine

## 2021-04-08 DIAGNOSIS — L253 Unspecified contact dermatitis due to other chemical products: Secondary | ICD-10-CM

## 2021-04-08 NOTE — Progress Notes (Signed)
    Follow-up Note  RE: Whitney Lawrence MRN: [id number] DOB: Aug 12, 1961 Date of Office Visit: 04/08/2021  Primary care provider: Allwardt, Crist Infante, PA-C Referring provider: Allwardt, Crist Infante, PA-C   [name] returns to the office today for the initial patch test interpretation, given suspected history of contact dermatitis.    Diagnostics:   TRUE TEST 48-hour hour reading: positive reaction to #28 (Gold sodium thiosulfate)  Plan:   Allergic contact dermatitis - The patient has been provided detailed information regarding the substances she is sensitive to, as well as products containing the substances.   - Meticulous avoidance of these substances is recommended.  - If avoidance is not possible, the use of barrier creams or lotions is recommended. - If symptoms persist or progress, despite meticulous avoidance of gold sodium thiosulfate, a dermatology referral may be warranted.  Thank you for the opportunity to care for this patient.  Please do not hesitate to contact me with questions.  Thermon Leyland, FNP [facility]

## 2021-04-08 NOTE — Patient Instructions (Addendum)
Allergic contact dermatitis - The patient has been provided detailed information regarding the substances she is sensitive to as well as products containing the substances.   - Meticulous avoidance of these substances is recommended.  - If avoidance is not possible, the use of barrier creams or lotions is recommended. - If symptoms persist or progress, despite meticulous avoidance of gold sodium thiosulfate, a dermatology referral may be warranted.  Call the clinic if this treatment plan is not working well for you  Follow up in 2 days or sooner if needed.

## 2021-04-10 ENCOUNTER — Encounter: Payer: Self-pay | Admitting: Allergy

## 2021-04-10 ENCOUNTER — Other Ambulatory Visit: Payer: Self-pay

## 2021-04-10 ENCOUNTER — Ambulatory Visit (INDEPENDENT_AMBULATORY_CARE_PROVIDER_SITE_OTHER): Payer: No Typology Code available for payment source | Admitting: Allergy

## 2021-04-10 DIAGNOSIS — L253 Unspecified contact dermatitis due to other chemical products: Secondary | ICD-10-CM | POA: Diagnosis not present

## 2021-04-10 NOTE — Progress Notes (Signed)
    Follow-up Note  RE: Whitney Lawrence MRN: [id number] DOB: 06/10/1961 Date of Office Visit: 04/10/2021  Primary care provider: Allwardt, Crist Infante, PA-C Referring provider: Allwardt, Crist Infante, PA-C   [name] returns to the office today for the final patch test interpretation, given suspected history of contact dermatitis.   She states she does get a rash whenever she has used Neosporin in the past.  She does not believe these results of explain the issue she has had with her lips.  Diagnostics:  TRUE TEST 96 hour reading: Positive reaction to #3 neomycin, #33 bacitracin, #28 gold sodium thiosulfate   TRUE TEST 48-hour hour reading: positive reaction to #28 (Gold sodium thiosulfate)  Plan:  Allergic contact dermatitis  The patient has been provided detailed information regarding the substances she is sensitive to, as well as products containing the substances.  Meticulous avoidance of these substances is recommended. If avoidance is not possible, the use of barrier creams or lotions is recommended.  We will email her a product safe list to her email address at [email or web address]  She will keep her follow-up appointment with Dr. Selena Batten in a month  Margo Aye, MD [facility]

## 2021-04-15 ENCOUNTER — Encounter: Payer: Self-pay | Admitting: Allergy

## 2021-05-01 ENCOUNTER — Encounter: Payer: Self-pay | Admitting: Allergy

## 2021-05-06 ENCOUNTER — Ambulatory Visit: Payer: No Typology Code available for payment source | Admitting: Allergy

## 2021-09-17 ENCOUNTER — Other Ambulatory Visit: Payer: Self-pay

## 2021-09-17 ENCOUNTER — Telehealth: Payer: Self-pay

## 2021-09-17 MED ORDER — LEVOTHYROXINE SODIUM 50 MCG PO TABS: 50.0000 ug | ORAL_TABLET | Freq: Every day | ORAL | 1 refills | Status: DC

## 2021-09-17 NOTE — Telephone Encounter (Signed)
MEDICATION: levothyroxine (SYNTHROID) 50 MCG tablet  PHARMACY:  [facility] [place], [place] - [address] Phone:  [phone]  Fax:  [phone]      Comments: Patient is down to her last pill   **Let patient know to contact pharmacy at the end of the day to make sure medication is ready. **  ** Please notify patient to allow 48-72 hours to process**  **Encourage patient to contact the pharmacy for refills or they can request refills through [facility]**

## 2022-03-05 ENCOUNTER — Other Ambulatory Visit: Payer: Self-pay | Admitting: Physician Assistant

## 2022-04-20 ENCOUNTER — Telehealth: Payer: Self-pay | Admitting: Physician Assistant

## 2022-04-20 NOTE — Telephone Encounter (Signed)
..   Encourage patient to contact the pharmacy for refills or they can request refills through [facility]  LAST APPOINTMENT DATE:  Please schedule appointment if longer than 1 year  NEXT APPOINTMENT DATE: none on file   MEDICATION: levothyroxine (SYNTHROID) 50 MCG tablet  Is the patient out of medication?   PHARMACY: [facility] on file   Let patient know to contact pharmacy at the end of the day to make sure medication is ready.  Please notify patient to allow 48-72 hours to process

## 2022-04-21 ENCOUNTER — Other Ambulatory Visit: Payer: Self-pay

## 2022-04-21 MED ORDER — LEVOTHYROXINE SODIUM 50 MCG PO TABS: 50.0000 ug | ORAL_TABLET | Freq: Every day | ORAL | 0 refills | Status: AC

## 2022-04-21 NOTE — Telephone Encounter (Signed)
Pt is losing insurance 04/28/2022 and trying to get another refill to hold her over until finding another job and getting new insurance. Please advise

## 2022-04-21 NOTE — Telephone Encounter (Signed)
Rx refills sent to pt pharmacy 

## 2022-08-23 ENCOUNTER — Encounter: Payer: Self-pay | Admitting: *Deleted

## 2022-11-11 ENCOUNTER — Encounter: Payer: Self-pay | Admitting: *Deleted

## 2023-07-20 LAB — HM MAMMOGRAPHY

## 2023-12-06 NOTE — Progress Notes (Deleted)
   LILLETTE Ileana Collet, PhD, LAT, ATC acting as a scribe for Artist Lloyd, MD.  Whitney Lawrence is a 63 y.o. female who presents to [facility] at [facility] today for osteoporosis management. She works as a health visitor.   DEXA scan (date, T-score): 10/26/23: Spine= -3.1, L-FN= -2.6, R-FN= -2.1 Prior treatment: *** History of Hip, Spine, or Wrist Fx: *** Heart disease or stroke: *** Cancer: no Kidney Disease: *** Gastric/Peptic Ulcer: *** Gastric bypass surgery: *** Severe GERD: *** Hx of seizures: *** Age at Menopause: *** Calcium intake: *** Vitamin D  intake: *** Hormone replacement therapy: *** Smoking history: never smoked Alcohol: none Exercise: 3-4x/wk Major dental work in past year: *** Parents with hip/spine fracture: *** Height loss: ***   Pertinent review of systems: ***  Relevant historical information: ***   Exam:  LMP 12/13/2016  General: Well Developed, well nourished, and in no acute distress.   MSK: ***    Lab and Radiology Results No results found for this or any previous visit (from the past 72 hours). No results found.     Assessment and Plan: 63 y.o. female with ***   PDMP not reviewed this encounter. No orders of the defined types were placed in this encounter.  No orders of the defined types were placed in this encounter.    Discussed warning signs or symptoms. Please see discharge instructions. Patient expresses understanding.   ***

## 2023-12-07 ENCOUNTER — Ambulatory Visit: Payer: Self-pay | Admitting: Family Medicine

## 2023-12-22 ENCOUNTER — Ambulatory Visit: Payer: Self-pay | Admitting: Family Medicine

## 2023-12-29 ENCOUNTER — Encounter: Payer: Self-pay | Admitting: Physician Assistant
